# Patient Record
Sex: Female | Born: 1940
Health system: Southern US, Community
[De-identification: ages and names within clinical notes are randomized; demographics above are authoritative.]

## PROBLEM LIST (undated history)

## (undated) DIAGNOSIS — K635 Polyp of colon: Secondary | ICD-10-CM

## (undated) DIAGNOSIS — M1990 Unspecified osteoarthritis, unspecified site: Secondary | ICD-10-CM

## (undated) DIAGNOSIS — K8020 Calculus of gallbladder without cholecystitis without obstruction: Secondary | ICD-10-CM

## (undated) DIAGNOSIS — H269 Unspecified cataract: Secondary | ICD-10-CM

## (undated) DIAGNOSIS — I4891 Unspecified atrial fibrillation: Secondary | ICD-10-CM

## (undated) DIAGNOSIS — I10 Essential (primary) hypertension: Secondary | ICD-10-CM

## (undated) DIAGNOSIS — E669 Obesity, unspecified: Secondary | ICD-10-CM

## (undated) DIAGNOSIS — D649 Anemia, unspecified: Secondary | ICD-10-CM

## (undated) DIAGNOSIS — E039 Hypothyroidism, unspecified: Secondary | ICD-10-CM

## (undated) DIAGNOSIS — Z923 Personal history of irradiation: Secondary | ICD-10-CM

## (undated) DIAGNOSIS — K5790 Diverticulosis of intestine, part unspecified, without perforation or abscess without bleeding: Secondary | ICD-10-CM

## (undated) DIAGNOSIS — C50919 Malignant neoplasm of unspecified site of unspecified female breast: Secondary | ICD-10-CM

## (undated) DIAGNOSIS — E785 Hyperlipidemia, unspecified: Secondary | ICD-10-CM

## (undated) DIAGNOSIS — K5900 Constipation, unspecified: Secondary | ICD-10-CM

## (undated) HISTORY — DX: Obesity, unspecified: E66.9

## (undated) HISTORY — DX: Hypothyroidism, unspecified: E03.9

## (undated) HISTORY — DX: Hyperlipidemia, unspecified: E78.5

## (undated) HISTORY — PX: BREAST LUMPECTOMY: SHX2

## (undated) HISTORY — PX: LYMPH NODE DISSECTION: SHX5087

## (undated) HISTORY — DX: Essential (primary) hypertension: I10

## (undated) HISTORY — DX: Polyp of colon: K63.5

## (undated) HISTORY — DX: Calculus of gallbladder without cholecystitis without obstruction: K80.20

## (undated) HISTORY — PX: POLYPECTOMY: SHX149

## (undated) HISTORY — PX: EYE SURGERY: SHX253

## (undated) HISTORY — PX: CATARACT EXTRACTION, BILATERAL: SHX1313

## (undated) HISTORY — PX: FOOT SURGERY: SHX648

## (undated) HISTORY — DX: Diverticulosis of intestine, part unspecified, without perforation or abscess without bleeding: K57.90

## (undated) HISTORY — PX: OTHER SURGICAL HISTORY: SHX169

## (undated) HISTORY — DX: Unspecified atrial fibrillation: I48.91

## (undated) HISTORY — PX: COLONOSCOPY: SHX174

## (undated) HISTORY — DX: Constipation, unspecified: K59.00

## (undated) HISTORY — DX: Unspecified cataract: H26.9

## (undated) HISTORY — DX: Anemia, unspecified: D64.9

## (undated) HISTORY — DX: Unspecified osteoarthritis, unspecified site: M19.90

## (undated) HISTORY — DX: Malignant neoplasm of unspecified site of unspecified female breast: C50.919

---
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
## 1995-09-18 DIAGNOSIS — Z923 Personal history of irradiation: Secondary | ICD-10-CM

## 1995-09-18 HISTORY — DX: Personal history of irradiation: Z92.3

## 1997-12-16 ENCOUNTER — Encounter: Admission: RE | Admit: 1997-12-16 | Discharge: 1998-03-16 | Payer: Self-pay | Admitting: *Deleted

## 1998-06-30 ENCOUNTER — Other Ambulatory Visit: Admission: RE | Admit: 1998-06-30 | Discharge: 1998-06-30 | Payer: Self-pay | Admitting: *Deleted

## 1999-06-09 ENCOUNTER — Other Ambulatory Visit: Admission: RE | Admit: 1999-06-09 | Discharge: 1999-06-09 | Payer: Self-pay | Admitting: *Deleted

## 2000-06-17 ENCOUNTER — Other Ambulatory Visit: Admission: RE | Admit: 2000-06-17 | Discharge: 2000-06-17 | Payer: Self-pay | Admitting: *Deleted

## 2000-10-09 ENCOUNTER — Encounter: Admission: RE | Admit: 2000-10-09 | Discharge: 2000-10-09 | Payer: Self-pay | Admitting: Emergency Medicine

## 2000-10-09 ENCOUNTER — Encounter: Payer: Self-pay | Admitting: Emergency Medicine

## 2001-02-25 ENCOUNTER — Encounter: Payer: Self-pay | Admitting: Emergency Medicine

## 2001-02-25 ENCOUNTER — Encounter: Admission: RE | Admit: 2001-02-25 | Discharge: 2001-02-25 | Payer: Self-pay | Admitting: Emergency Medicine

## 2001-04-14 ENCOUNTER — Encounter (INDEPENDENT_AMBULATORY_CARE_PROVIDER_SITE_OTHER): Payer: Self-pay | Admitting: *Deleted

## 2001-04-14 ENCOUNTER — Ambulatory Visit (HOSPITAL_COMMUNITY): Admission: RE | Admit: 2001-04-14 | Discharge: 2001-04-14 | Payer: Self-pay | Admitting: Internal Medicine

## 2001-04-14 ENCOUNTER — Encounter: Payer: Self-pay | Admitting: Internal Medicine

## 2001-06-09 ENCOUNTER — Other Ambulatory Visit: Admission: RE | Admit: 2001-06-09 | Discharge: 2001-06-09 | Payer: Self-pay | Admitting: *Deleted

## 2001-12-18 ENCOUNTER — Encounter: Admission: RE | Admit: 2001-12-18 | Discharge: 2001-12-18 | Payer: Self-pay | Admitting: Emergency Medicine

## 2001-12-18 ENCOUNTER — Encounter: Payer: Self-pay | Admitting: Emergency Medicine

## 2002-04-28 ENCOUNTER — Encounter: Payer: Self-pay | Admitting: Internal Medicine

## 2002-04-28 ENCOUNTER — Ambulatory Visit (HOSPITAL_COMMUNITY): Admission: RE | Admit: 2002-04-28 | Discharge: 2002-04-28 | Payer: Self-pay | Admitting: Internal Medicine

## 2002-06-29 ENCOUNTER — Other Ambulatory Visit: Admission: RE | Admit: 2002-06-29 | Discharge: 2002-06-29 | Payer: Self-pay | Admitting: Obstetrics & Gynecology

## 2003-07-08 ENCOUNTER — Other Ambulatory Visit: Admission: RE | Admit: 2003-07-08 | Discharge: 2003-07-08 | Payer: Self-pay | Admitting: Obstetrics & Gynecology

## 2003-09-18 DIAGNOSIS — K635 Polyp of colon: Secondary | ICD-10-CM

## 2003-09-18 HISTORY — DX: Polyp of colon: K63.5

## 2003-10-06 ENCOUNTER — Encounter: Admission: RE | Admit: 2003-10-06 | Discharge: 2003-10-06 | Payer: Self-pay | Admitting: Emergency Medicine

## 2004-07-11 ENCOUNTER — Other Ambulatory Visit: Admission: RE | Admit: 2004-07-11 | Discharge: 2004-07-11 | Payer: Self-pay | Admitting: Obstetrics & Gynecology

## 2005-01-10 ENCOUNTER — Encounter (HOSPITAL_COMMUNITY): Admission: RE | Admit: 2005-01-10 | Discharge: 2005-04-10 | Payer: Self-pay | Admitting: Internal Medicine

## 2005-07-11 ENCOUNTER — Other Ambulatory Visit: Admission: RE | Admit: 2005-07-11 | Discharge: 2005-07-11 | Payer: Self-pay | Admitting: Obstetrics & Gynecology

## 2005-09-11 IMAGING — CT CT ABDOMEN W/ CM
1 series · 15 of 32 positions shown, 19 images · IV contrast (GASTRO. & OMNIPAQUE [ID])
Comparison: none

CLINICAL DATA: Abdominal pain, particularly RLQ.  Con ? V14.8,
 CT ABDOMEN WITH CONTRAST
 Multidetector helical scans through the abdomen were performed after oral and IV contrast media were given. 150 cc of Omnipaque 300 were given as the contrast media to this patient with a history of asthma as well as sickle cell trait.  
 The lung bases are clear.  The liver enhances normally with no focal abnormality and no ductal dilatation is seen. There are one or two rounded structures which are noncalcified within the gallbladder.  This may represent noncalcified gallstones or gallbladder sludge and ultrasound of the gallbladder may be warranted.  The pancreas is relatively fatty infiltrated.  The adrenal glands and spleen appear normal. The kidneys enhance well with only a few small subcentimeter rounded low attenuation structures most consistent with incidental cysts, but too to characterize.  No adenopathy is seen. The abdominal aorta is normal in caliber.  
 IMPRESSION
 1.  Rounded low attenuation structures in the gallbladder may represent noncalcified gallstones or sludge.  Consider ultrasound of the gallbladder to assess further. 
 2.  Fatty infiltration of the pancreas. 
 3.  Small low attenuation rounded renal structures consistent with cysts, but too small to characterize.
 CT PELVIS WITH CONTRAST
 Scans were continued through the pelvis after oral and IV contrast media were given. The appendix is well seen and appears normal. The ureters are normal in caliber.  The uterus is normal in size.  The urinary bladder is unremarkable.  No pelvic mass or adenopathy is seen. 
 Negative CT of the pelvis.  The appendix is well seen and appears normal.

[Series 2: appendicitis · axial · 0.98mm/px · z∈[-414,-2]mm · 15 of 140 slices shown, 19 images]
[im 9/140  soft-tissue]
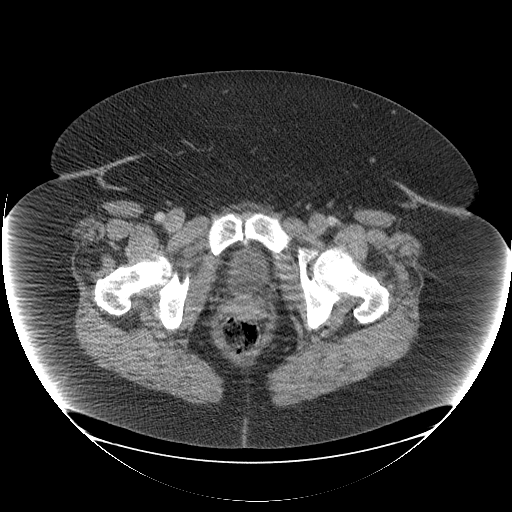
[im 9/140  bone]
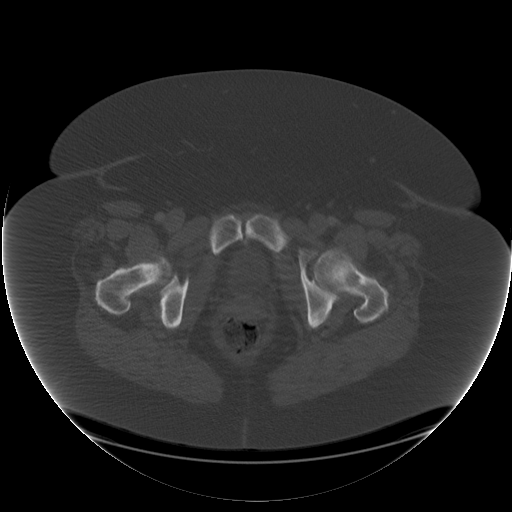
[im 18/140  soft-tissue]
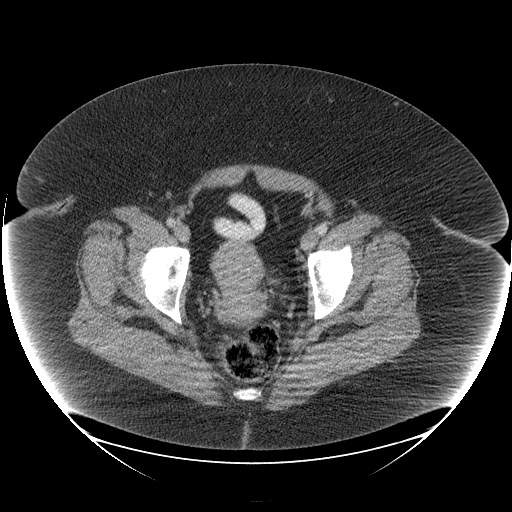
[im 27/140  soft-tissue]
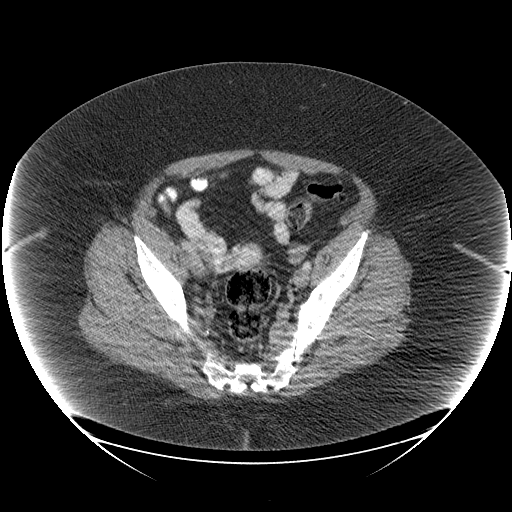
[im 41/140  soft-tissue]
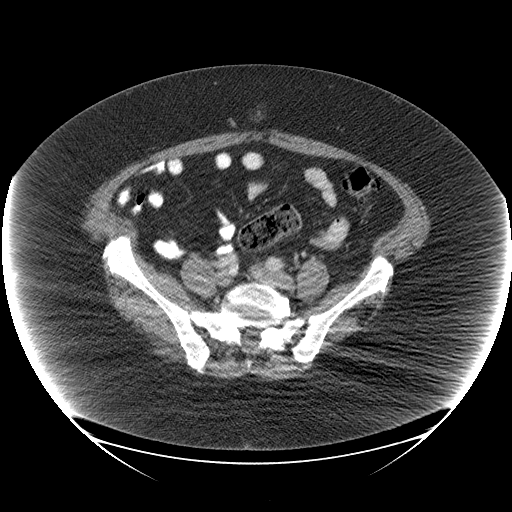
[im 50/140  soft-tissue]
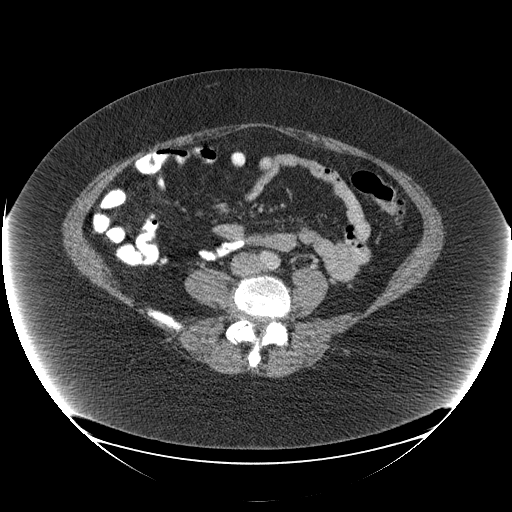
[im 59/140  soft-tissue]
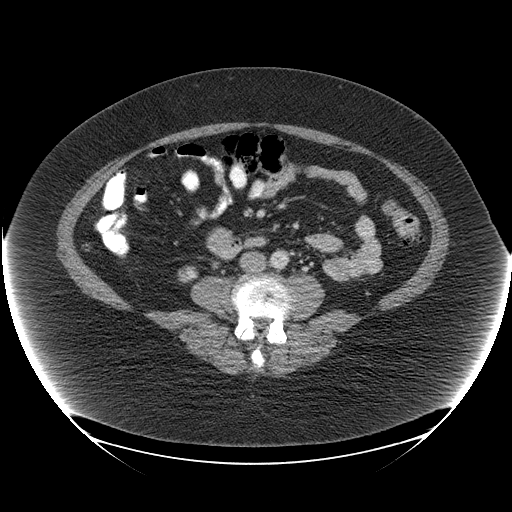
[im 72/140  soft-tissue]
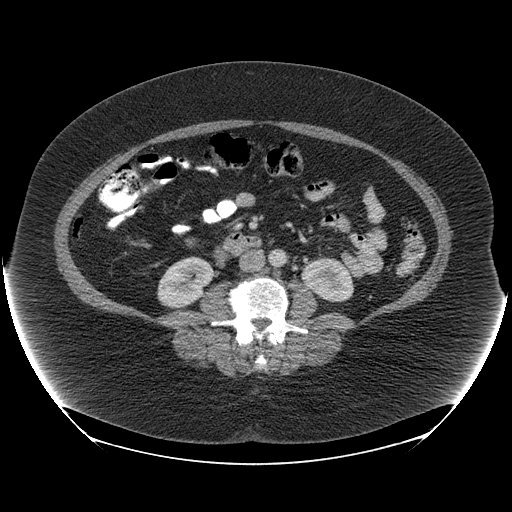
[im 81/140  soft-tissue]
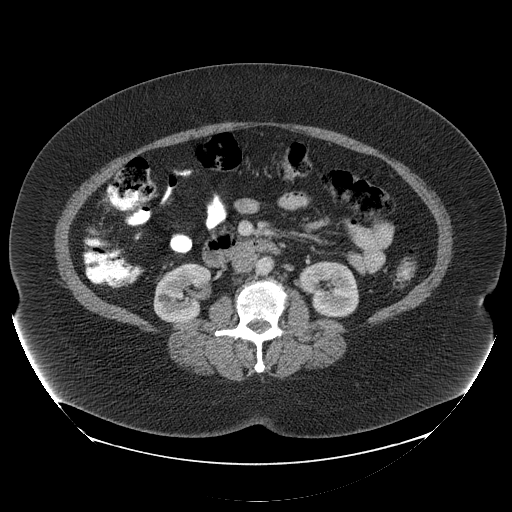
[im 90/140  soft-tissue]
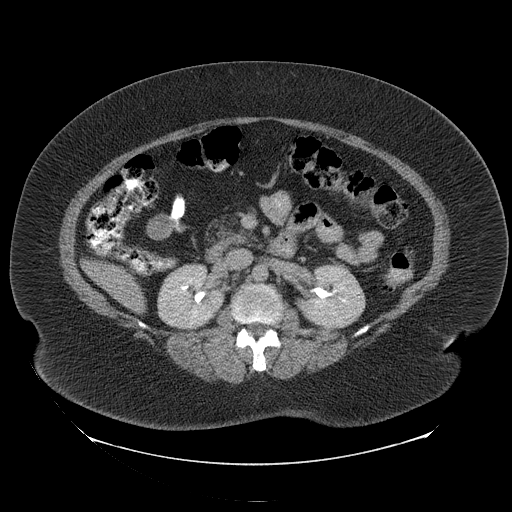
[im 90/140  bone]
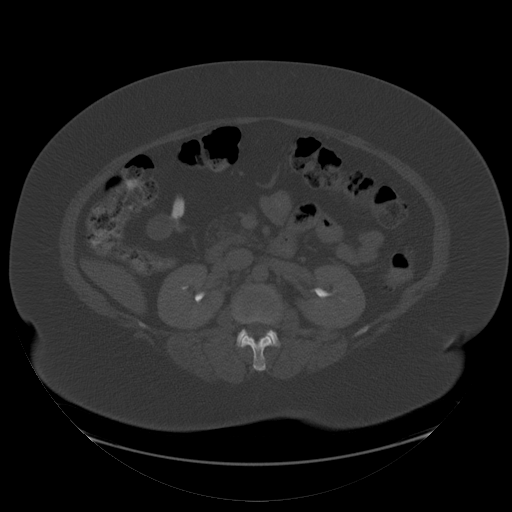
[im 99/140  soft-tissue]
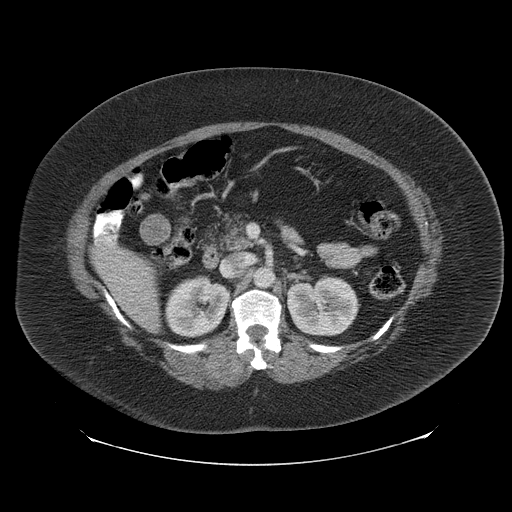
[im 113/140  soft-tissue]
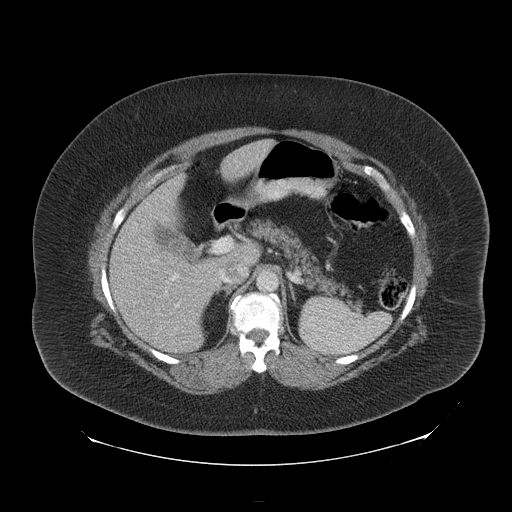
[im 122/140  soft-tissue]
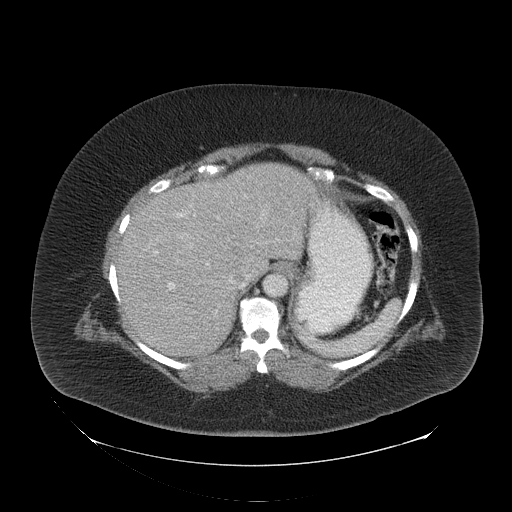
[im 122/140  lung]
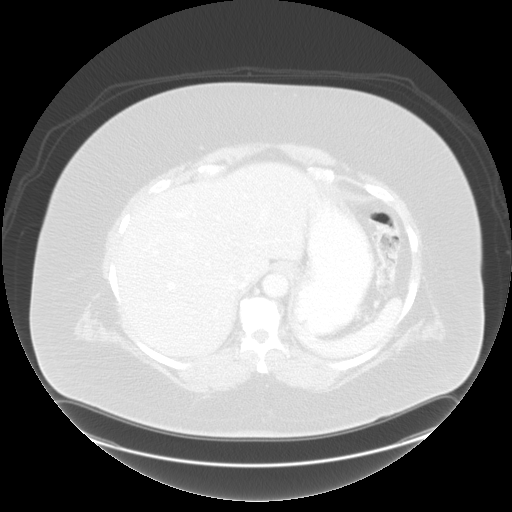
[im 126/140  lung]
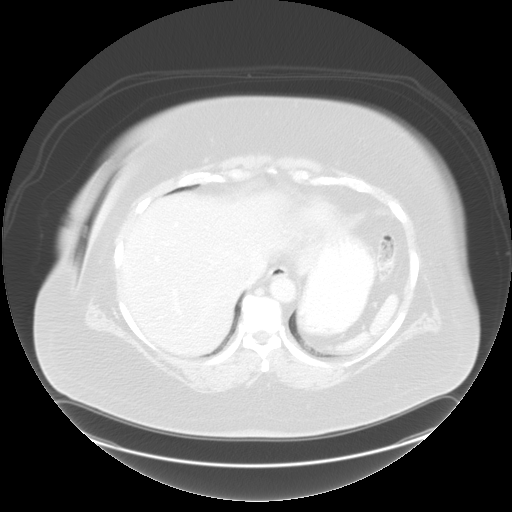
[im 131/140  soft-tissue]
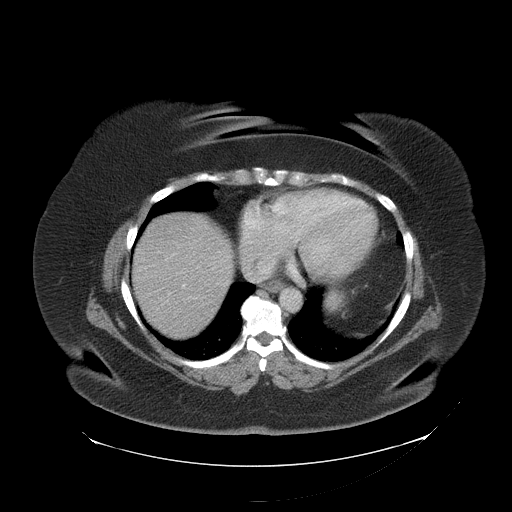
[im 131/140  lung]
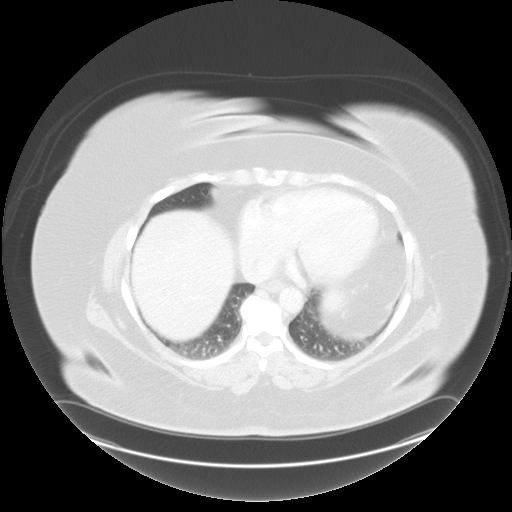
[im 135/140  lung]
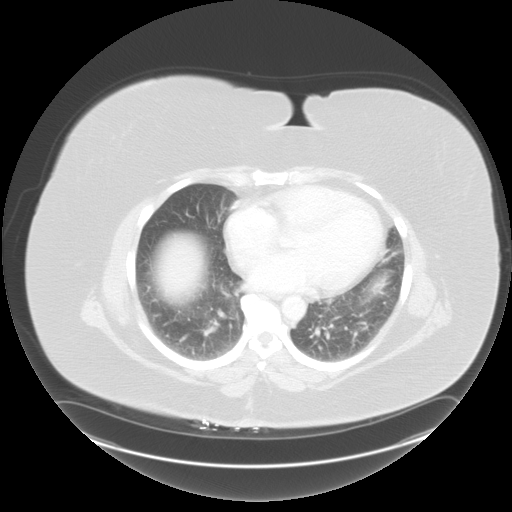

[15 of 32 positions shown; findings below may reference images not displayed]

## 2006-05-27 ENCOUNTER — Encounter: Admission: RE | Admit: 2006-05-27 | Discharge: 2006-05-27 | Payer: Self-pay | Admitting: Emergency Medicine

## 2006-11-27 ENCOUNTER — Ambulatory Visit: Payer: Self-pay | Admitting: Cardiology

## 2006-12-02 ENCOUNTER — Ambulatory Visit: Payer: Self-pay | Admitting: Internal Medicine

## 2006-12-10 ENCOUNTER — Ambulatory Visit: Payer: Self-pay

## 2006-12-10 ENCOUNTER — Ambulatory Visit: Payer: Self-pay | Admitting: Cardiology

## 2006-12-10 ENCOUNTER — Encounter: Payer: Self-pay | Admitting: Cardiology

## 2006-12-13 ENCOUNTER — Ambulatory Visit: Payer: Self-pay | Admitting: Cardiology

## 2006-12-17 ENCOUNTER — Ambulatory Visit: Payer: Self-pay | Admitting: Internal Medicine

## 2006-12-17 ENCOUNTER — Ambulatory Visit: Payer: Self-pay | Admitting: Cardiology

## 2006-12-27 ENCOUNTER — Ambulatory Visit: Payer: Self-pay | Admitting: Internal Medicine

## 2007-01-02 ENCOUNTER — Ambulatory Visit: Payer: Self-pay | Admitting: Cardiology

## 2007-01-02 ENCOUNTER — Ambulatory Visit: Payer: Self-pay | Admitting: Internal Medicine

## 2007-01-08 ENCOUNTER — Ambulatory Visit: Payer: Self-pay | Admitting: Internal Medicine

## 2007-01-14 ENCOUNTER — Ambulatory Visit: Payer: Self-pay | Admitting: Cardiology

## 2007-01-21 ENCOUNTER — Ambulatory Visit: Payer: Self-pay | Admitting: Cardiology

## 2007-01-28 ENCOUNTER — Ambulatory Visit: Payer: Self-pay | Admitting: Cardiology

## 2007-02-03 ENCOUNTER — Ambulatory Visit: Payer: Self-pay | Admitting: Cardiology

## 2007-02-03 ENCOUNTER — Ambulatory Visit (HOSPITAL_COMMUNITY): Admission: RE | Admit: 2007-02-03 | Discharge: 2007-02-03 | Payer: Self-pay | Admitting: Cardiology

## 2007-02-13 ENCOUNTER — Ambulatory Visit: Payer: Self-pay | Admitting: *Deleted

## 2007-02-17 ENCOUNTER — Ambulatory Visit: Payer: Self-pay | Admitting: Cardiology

## 2007-03-13 ENCOUNTER — Ambulatory Visit: Payer: Self-pay | Admitting: Cardiology

## 2007-04-15 ENCOUNTER — Ambulatory Visit: Payer: Self-pay | Admitting: Cardiology

## 2007-04-29 ENCOUNTER — Ambulatory Visit: Payer: Self-pay | Admitting: Cardiology

## 2007-05-02 ENCOUNTER — Ambulatory Visit: Payer: Self-pay | Admitting: Cardiology

## 2007-05-06 ENCOUNTER — Ambulatory Visit: Payer: Self-pay | Admitting: Cardiology

## 2007-05-22 ENCOUNTER — Ambulatory Visit: Payer: Self-pay | Admitting: Cardiology

## 2007-05-29 ENCOUNTER — Ambulatory Visit: Payer: Self-pay | Admitting: Internal Medicine

## 2007-05-29 ENCOUNTER — Ambulatory Visit: Payer: Self-pay | Admitting: Cardiology

## 2007-06-16 ENCOUNTER — Ambulatory Visit: Payer: Self-pay | Admitting: Cardiovascular Disease

## 2007-06-27 ENCOUNTER — Ambulatory Visit: Payer: Self-pay | Admitting: Cardiology

## 2007-07-04 ENCOUNTER — Ambulatory Visit: Payer: Self-pay | Admitting: Cardiology

## 2007-07-18 ENCOUNTER — Ambulatory Visit: Payer: Self-pay | Admitting: Cardiology

## 2007-08-18 ENCOUNTER — Ambulatory Visit: Payer: Self-pay | Admitting: Internal Medicine

## 2007-09-08 ENCOUNTER — Ambulatory Visit: Payer: Self-pay | Admitting: Cardiology

## 2007-09-17 ENCOUNTER — Ambulatory Visit: Payer: Self-pay | Admitting: Cardiology

## 2007-10-01 ENCOUNTER — Ambulatory Visit: Payer: Self-pay | Admitting: Cardiovascular Disease

## 2007-10-22 ENCOUNTER — Ambulatory Visit: Payer: Self-pay | Admitting: Internal Medicine

## 2007-11-06 ENCOUNTER — Ambulatory Visit (HOSPITAL_COMMUNITY): Admission: RE | Admit: 2007-11-06 | Discharge: 2007-11-06 | Payer: Self-pay | Admitting: Internal Medicine

## 2007-11-06 ENCOUNTER — Ambulatory Visit: Payer: Self-pay | Admitting: Cardiology

## 2007-12-04 ENCOUNTER — Ambulatory Visit: Payer: Self-pay | Admitting: Cardiology

## 2007-12-19 ENCOUNTER — Ambulatory Visit: Payer: Self-pay | Admitting: Cardiology

## 2007-12-30 ENCOUNTER — Ambulatory Visit: Payer: Self-pay | Admitting: Cardiology

## 2008-01-01 ENCOUNTER — Ambulatory Visit: Payer: Self-pay | Admitting: Cardiology

## 2008-01-16 ENCOUNTER — Ambulatory Visit: Payer: Self-pay | Admitting: Internal Medicine

## 2008-02-13 ENCOUNTER — Ambulatory Visit: Payer: Self-pay | Admitting: Cardiology

## 2008-03-12 ENCOUNTER — Ambulatory Visit: Payer: Self-pay | Admitting: Cardiology

## 2008-03-26 ENCOUNTER — Ambulatory Visit: Payer: Self-pay | Admitting: Cardiovascular Disease

## 2008-04-23 ENCOUNTER — Ambulatory Visit: Payer: Self-pay | Admitting: Cardiology

## 2008-05-21 ENCOUNTER — Ambulatory Visit: Payer: Self-pay | Admitting: Cardiovascular Disease

## 2008-06-18 ENCOUNTER — Ambulatory Visit: Payer: Self-pay | Admitting: Cardiovascular Disease

## 2008-07-15 ENCOUNTER — Ambulatory Visit: Payer: Self-pay | Admitting: Cardiovascular Disease

## 2008-07-15 ENCOUNTER — Ambulatory Visit: Payer: Self-pay | Admitting: Cardiology

## 2008-07-18 DIAGNOSIS — I10 Essential (primary) hypertension: Secondary | ICD-10-CM | POA: Insufficient documentation

## 2008-07-18 DIAGNOSIS — E039 Hypothyroidism, unspecified: Secondary | ICD-10-CM | POA: Insufficient documentation

## 2008-07-18 DIAGNOSIS — I4821 Permanent atrial fibrillation: Secondary | ICD-10-CM

## 2008-07-18 DIAGNOSIS — Z853 Personal history of malignant neoplasm of breast: Secondary | ICD-10-CM

## 2008-07-18 DIAGNOSIS — J45909 Unspecified asthma, uncomplicated: Secondary | ICD-10-CM | POA: Insufficient documentation

## 2008-08-10 ENCOUNTER — Ambulatory Visit: Payer: Self-pay | Admitting: Cardiology

## 2008-09-07 ENCOUNTER — Ambulatory Visit: Payer: Self-pay | Admitting: Internal Medicine

## 2008-10-05 ENCOUNTER — Ambulatory Visit: Payer: Self-pay | Admitting: Cardiology

## 2008-11-02 ENCOUNTER — Ambulatory Visit: Payer: Self-pay | Admitting: Cardiology

## 2008-11-07 ENCOUNTER — Encounter: Payer: Self-pay | Admitting: Cardiology

## 2008-11-23 ENCOUNTER — Ambulatory Visit: Payer: Self-pay | Admitting: Cardiology

## 2008-12-20 ENCOUNTER — Ambulatory Visit: Payer: Self-pay | Admitting: Cardiology

## 2009-01-17 ENCOUNTER — Ambulatory Visit: Payer: Self-pay | Admitting: Cardiology

## 2009-02-15 ENCOUNTER — Encounter: Payer: Self-pay | Admitting: *Deleted

## 2009-02-15 ENCOUNTER — Ambulatory Visit: Payer: Self-pay | Admitting: Cardiology

## 2009-03-01 ENCOUNTER — Telehealth: Payer: Self-pay | Admitting: Cardiology

## 2009-03-03 ENCOUNTER — Encounter: Payer: Self-pay | Admitting: Cardiology

## 2009-03-15 ENCOUNTER — Ambulatory Visit: Payer: Self-pay | Admitting: Internal Medicine

## 2009-03-15 LAB — CONVERTED CEMR LAB
POC INR: 2
Prothrombin Time: 17.6 s

## 2009-03-23 ENCOUNTER — Encounter: Payer: Self-pay | Admitting: *Deleted

## 2009-04-08 ENCOUNTER — Ambulatory Visit: Payer: Self-pay | Admitting: Internal Medicine

## 2009-04-08 LAB — CONVERTED CEMR LAB: Prothrombin Time: 19.5 s

## 2009-05-04 ENCOUNTER — Encounter (INDEPENDENT_AMBULATORY_CARE_PROVIDER_SITE_OTHER): Payer: Self-pay | Admitting: *Deleted

## 2009-05-06 ENCOUNTER — Ambulatory Visit: Payer: Self-pay | Admitting: Cardiovascular Disease

## 2009-06-03 ENCOUNTER — Ambulatory Visit: Payer: Self-pay | Admitting: Internal Medicine

## 2009-07-01 ENCOUNTER — Ambulatory Visit: Payer: Self-pay | Admitting: Internal Medicine

## 2009-07-01 LAB — CONVERTED CEMR LAB: POC INR: 2.5

## 2009-07-15 ENCOUNTER — Ambulatory Visit: Payer: Self-pay | Admitting: Cardiology

## 2009-07-15 DIAGNOSIS — Z6841 Body Mass Index (BMI) 40.0 and over, adult: Secondary | ICD-10-CM

## 2009-07-29 ENCOUNTER — Ambulatory Visit: Payer: Self-pay | Admitting: Cardiology

## 2009-07-29 LAB — CONVERTED CEMR LAB: POC INR: 2.3

## 2009-08-26 ENCOUNTER — Ambulatory Visit: Payer: Self-pay | Admitting: Cardiology

## 2009-09-23 ENCOUNTER — Ambulatory Visit: Payer: Self-pay | Admitting: Cardiology

## 2009-09-23 LAB — CONVERTED CEMR LAB: POC INR: 2.2

## 2009-10-21 ENCOUNTER — Ambulatory Visit: Payer: Self-pay | Admitting: Internal Medicine

## 2009-10-21 LAB — CONVERTED CEMR LAB: POC INR: 2.4

## 2009-10-24 ENCOUNTER — Encounter: Payer: Self-pay | Admitting: Cardiology

## 2009-10-24 ENCOUNTER — Telehealth: Payer: Self-pay | Admitting: Cardiology

## 2009-10-27 ENCOUNTER — Encounter: Payer: Self-pay | Admitting: Cardiology

## 2009-11-07 ENCOUNTER — Encounter: Payer: Self-pay | Admitting: Cardiology

## 2009-11-18 ENCOUNTER — Encounter: Payer: Self-pay | Admitting: Cardiology

## 2009-11-18 ENCOUNTER — Ambulatory Visit: Payer: Self-pay | Admitting: Cardiology

## 2009-11-18 LAB — CONVERTED CEMR LAB: POC INR: 2.1

## 2009-12-02 ENCOUNTER — Ambulatory Visit: Payer: Self-pay | Admitting: Cardiology

## 2009-12-04 LAB — CONVERTED CEMR LAB
Eosinophils Absolute: 0.2 10*3/uL (ref 0.0–0.7)
Eosinophils Relative: 2.6 % (ref 0.0–5.0)
Hemoglobin: 13.3 g/dL (ref 12.0–15.0)
MCHC: 32.5 g/dL (ref 30.0–36.0)
Monocytes Absolute: 0.8 10*3/uL (ref 0.1–1.0)
Neutro Abs: 3.9 10*3/uL (ref 1.4–7.7)
Neutrophils Relative %: 59.3 % (ref 43.0–77.0)
RDW: 15.3 % — ABNORMAL HIGH (ref 11.5–14.6)

## 2009-12-27 ENCOUNTER — Ambulatory Visit: Payer: Self-pay | Admitting: Cardiology

## 2010-04-11 ENCOUNTER — Encounter: Admission: RE | Admit: 2010-04-11 | Discharge: 2010-04-11 | Payer: Self-pay | Admitting: Emergency Medicine

## 2010-07-05 ENCOUNTER — Telehealth (INDEPENDENT_AMBULATORY_CARE_PROVIDER_SITE_OTHER): Payer: Self-pay | Admitting: *Deleted

## 2010-07-14 ENCOUNTER — Ambulatory Visit: Payer: Self-pay | Admitting: Cardiology

## 2010-07-14 ENCOUNTER — Encounter: Payer: Self-pay | Admitting: Cardiology

## 2010-07-14 DIAGNOSIS — R0602 Shortness of breath: Secondary | ICD-10-CM | POA: Insufficient documentation

## 2010-08-02 ENCOUNTER — Ambulatory Visit: Payer: Self-pay | Admitting: Cardiology

## 2010-08-02 ENCOUNTER — Ambulatory Visit: Payer: Self-pay

## 2010-10-17 NOTE — Progress Notes (Signed)
Summary: call from MD  - can pt come off Pradaxa for a breast biopsy  Phone Note From Other Clinic   Caller: Dr. Dossie Der Summary of Call: ? whether to hold Pradaxa   Follow-up for Phone Call        Dr. Dossie Der calls today about whether to stop pradaxa or not.   She is aware the bleeding risks are the same as for Coumadin and in this case if she was on coumadin they would hold it.  Mrs. [name] [name] will need a 14 gauge needle biopsy of a calcified area of the breast.  This is scheduled for 10/26. I will forward to Dr. Antoine Poche for his recommendation. She is aware he is in [place] today and will await an answer this week.  We will call Dr. Annell Greening back at [phone] and she will inform Mrs. [name] [name]. Mylo Red RN     Appended Document: call from MD  - can pt come off Pradaxa for a breast biopsy Call Shanda Bumps [phone]  Appended Document: call from MD  - can pt come off Pradaxa for a breast biopsy I believe that her most recent creat clearance was OK.  However, since I do not see a recent creat I would hold her Pradaxa x 6 doses prior to the procedure.  Due to the rapid onset of action, this should not be restarted until it is felt that the risk of bleeding from the procedure is minimal.  She will need to get this instruction from the physician performing the procedure.  Appended Document: call from MD  - can pt come off Pradaxa for a breast biopsy left message for Shanda Bumps of the above instructions from Dr Antoine Poche

## 2010-10-17 NOTE — Medication Information (Signed)
Summary: [name] [name]  Anticoagulant Therapy  Managed by: Weston Brass, Pharm D Referring MD: Rollene Rotunda MD PCP: Dr. Leslee Home Supervising MD: Antoine Poche MD, Fayrene Fearing Indication 1: Atrial Fibrillation (ICD-427.31) Lab Used: [facility] Site: [facility] INR POC 2.2 INR RANGE 2 - 3  Dietary changes: no    Health status changes: yes       Details: had a cold  Bleeding/hemorrhagic complications: no    Recent/future hospitalizations: no    Any changes in medication regimen? no    Recent/future dental: no  Any missed doses?: no       Is patient compliant with meds? yes       Allergies (verified): 1)  Cortisone  Anticoagulation Management History:      The patient is taking warfarin and comes in today for a routine follow up visit.  Positive risk factors for bleeding include an age of 70 years or older.  The bleeding index is 'intermediate risk'.  Positive CHADS2 values include History of HTN.  Negative CHADS2 values include Age > 76 years old.  The start date was 11/21/2006.  Anticoagulation responsible provider: Antoine Poche MD, Fayrene Fearing.  INR POC: 2.2.  Cuvette Lot#: [id number].  Exp: 10/2010.    Anticoagulation Management Assessment/Plan:      The patient's current anticoagulation dose is Coumadin 5 mg tabs: Take as directed by coumadin clinic..  The target INR is 2.0-3.0.  The next INR is due 10/21/2009.  Anticoagulation instructions were given to patient.  Results were reviewed/authorized by Weston Brass, Pharm D.  She was notified by Ysidro Evert, Pharm D Candidate.         Prior Anticoagulation Instructions: INR 2.9  Continue on same dosage 1/2 tablet daily except 1 tablet on Sundays and Thursdays.  Recheck in 4 weeks.     Current Anticoagulation Instructions: INR 2.2 at goal (2-3) Continue same dosage 2.5mg  daily except 5mg  on Sundays and Thursdays Recheck in 4 weeks

## 2010-10-17 NOTE — Progress Notes (Signed)
Summary: coumadin   Phone Note From Other Clinic   Caller: Provider Summary of Call: Dr Leslee Home [phone] ofc [phone] cell. pt on coumadin wants to know if she can go on new medication. Pradaxa.  Initial call taken by: Edman Circle,  October 24, 2009 4:11 PM  Follow-up for Phone Call        Discussed with Dr. Lorenz Coaster. Follow-up by: Rollene Rotunda, MD, [facility],  November 01, 2009 4:47 PM

## 2010-10-17 NOTE — Medication Information (Signed)
Summary: rov/ez  Anticoagulant Therapy  Managed by: Cloyde Reams, RN, BSN Referring MD: Rollene Rotunda MD PCP: Dr. Leslee Home Supervising MD: Johney Frame MD, Fayrene Fearing Indication 1: Atrial Fibrillation (ICD-427.31) Lab Used: [facility] Site: [facility] INR POC 2.4 INR RANGE 2 - 3  Dietary changes: no    Health status changes: no    Bleeding/hemorrhagic complications: no    Recent/future hospitalizations: no    Any changes in medication regimen? no    Recent/future dental: no  Any missed doses?: no       Is patient compliant with meds? yes       Allergies (verified): 1)  Cortisone  Anticoagulation Management History:      The patient is taking warfarin and comes in today for a routine follow up visit.  Positive risk factors for bleeding include an age of 70 years or older.  The bleeding index is 'intermediate risk'.  Positive CHADS2 values include History of HTN.  Negative CHADS2 values include Age > 70 years old.  The start date was 11/21/2006.  Anticoagulation responsible provider: Murriel Eidem MD, Fayrene Fearing.  INR POC: 2.4.  Cuvette Lot#: [id number].  Exp: 12/2010.    Anticoagulation Management Assessment/Plan:      The patient's current anticoagulation dose is Coumadin 5 mg tabs: Take as directed by coumadin clinic..  The target INR is 2.0-3.0.  The next INR is due 11/18/2009.  Anticoagulation instructions were given to patient.  Results were reviewed/authorized by Cloyde Reams, RN, BSN.  She was notified by Cloyde Reams RN.         Prior Anticoagulation Instructions: INR 2.2 at goal (2-3) Continue same dosage 2.5mg  daily except 5mg  on Sundays and Thursdays Recheck in 4 weeks  Current Anticoagulation Instructions: INR 2.4  Continue on same dosage 1/2 tablet daily except 1 tablet on Sundays and Thursdays.  Recheck in 4 weeks.

## 2010-10-17 NOTE — Medication Information (Signed)
Summary: Coumadin Clinic  Anticoagulant Therapy  Managed by: Inactive Referring MD: Rollene Rotunda MD PCP: Dr. Leslee Home Supervising MD: Juanda Chance MD, Jennine Peddy Indication 1: Atrial Fibrillation (ICD-427.31) Lab Used: [facility] Site: [facility] INR RANGE 2 - 3          Comments: starting pradaxa next week..instructed to hold 2 days of warfarin prior to starting pradaxa since INR on 3/4 was 2.1.2011.  Pt educated to have labs drawn per protocol.. mp  Allergies: 1)  Cortisone  Anticoagulation Management History:      Positive risk factors for bleeding include an age of 70 years or older.  The bleeding index is 'intermediate risk'.  Positive CHADS2 values include History of HTN.  Negative CHADS2 values include Age > 56 years old.  The start date was 11/21/2006.  Anticoagulation responsible provider: Juanda Chance MD, Smitty Cords.  Exp: 12/2010.    Anticoagulation Management Assessment/Plan:      The target INR is 2.0-3.0.  The next INR is due 11/18/2009.  Anticoagulation instructions were given to patient.  Results were reviewed/authorized by Inactive.         Prior Anticoagulation Instructions: INR 2.1  Take 1 tab each Sunday and Thursday and 0.5 tab on all other days.  Hold warfarin for 2 days prior to starting pradaxa.  Take pradaxa 2 times daily--if you develop GI upset or pain, CALL DR. KELLER immediately!  Have blood count completed 10 days after starting on pradaxa.

## 2010-10-17 NOTE — Medication Information (Signed)
Summary: rov/ewj  Anticoagulant Therapy  Managed by: Shelby Dubin, PharmD, BCPS, CPP Referring MD: Rollene Rotunda MD PCP: Dr. Leslee Home Supervising MD: Juanda Chance MD, Ehan Freas Indication 1: Atrial Fibrillation (ICD-427.31) Lab Used: [facility] Site: [facility] INR POC 2.1 INR RANGE 2 - 3  Dietary changes: no    Health status changes: no    Bleeding/hemorrhagic complications: no    Recent/future hospitalizations: no    Any changes in medication regimen? yes       Details: will begin niaspan and pradaxa  Recent/future dental: no  Any missed doses?: no       Is patient compliant with meds? yes       Current Medications (verified): 1)  Pindolol 5 Mg Tabs (Pindolol) .... 1/2 By Mouth Daily 2)  Cardizem Cd 120 Mg Xr24h-Cap (Diltiazem Hcl Coated Beads) .... Take 1 Capsule By Mouth Once A Day 3)  Hydrochlorothiazide 25 Mg Tabs (Hydrochlorothiazide) .... Take 1/2 Tablet Daily 4)  Vitamin C 500 Mg Tabs (Ascorbic Acid) .... Daily 5)  Vitamin D 1000 Unit Tabs (Cholecalciferol) .... Daily 6)  Multi-Vitamin  Tabs (Multiple Vitamin) .... Take 1 Tablet By Mouth Once A Day 7)  Niaspan 500 Mg Cr-Tabs (Niacin (Antihyperlipidemic)) .Marland Kitchen.. 1 By Mouth At Bedtime. 8)  Pradaxa 150 Mg Caps (Dabigatran Etexilate Mesylate) .Marland Kitchen.. 1 By Mouth Two Times A Day  Allergies (verified): 1)  Cortisone  Anticoagulation Management History:      The patient is taking warfarin and comes in today for a routine follow up visit.  Positive risk factors for bleeding include an age of 13 years or older.  The bleeding index is 'intermediate risk'.  Positive CHADS2 values include History of HTN.  Negative CHADS2 values include Age > 29 years old.  The start date was 11/21/2006.  Anticoagulation responsible provider: Juanda Chance MD, Smitty Cords.  INR POC: 2.1.  Exp: 12/2010.    Anticoagulation Management Assessment/Plan:      The target INR is 2.0-3.0.  The next INR is due 11/18/2009.  Anticoagulation instructions were given to  patient.  Results were reviewed/authorized by Shelby Dubin, PharmD, BCPS, CPP.  She was notified by Shelby Dubin PharmD, BCPS, CPP.         Prior Anticoagulation Instructions: INR 2.4  Continue on same dosage 1/2 tablet daily except 1 tablet on Sundays and Thursdays.  Recheck in 4 weeks.    Current Anticoagulation Instructions: INR 2.1  Take 1 tab each Sunday and Thursday and 0.5 tab on all other days.  Hold warfarin for 2 days prior to starting pradaxa.  Take pradaxa 2 times daily--if you develop GI upset or pain, CALL DR. KELLER immediately!  Have blood count completed 10 days after starting on pradaxa.

## 2010-10-17 NOTE — Assessment & Plan Note (Signed)
Summary: Dr Lorenz Coaster started her on Pradaxa   Visit Type:  Follow-up Primary Provider:  Dr. Leslee Home  CC:  Atrial Fibrillation.  History of Present Illness: The patient returns for folllow up of atrial fibrillation.  Since I last saw her she has been switched to Pradaxa.  She loves this new drug in the Freedom it provides. She's had no problems with it. She will occasionally feel some palpitations that she relates to the hydrochlorothiazide. However, these are relatively short-lived. She's not having any presyncope or syncope. She has no chest pressure, neck or arm discomfort. She has no significant shortness of breath, PND or orthopnea. She is dieting through her church and has lost some weight. She is exercising 4 were 5 times per week.  Current Medications (verified): 1)  Pindolol 5 Mg Tabs (Pindolol) .... 1/2 By Mouth Daily 2)  Cardizem Cd 120 Mg Xr24h-Cap (Diltiazem Hcl Coated Beads) .... Take 1 Capsule By Mouth Once A Day 3)  Hydrochlorothiazide 25 Mg Tabs (Hydrochlorothiazide) .... Take 1/2 Tablet Daily 4)  Vitamin C 500 Mg Tabs (Ascorbic Acid) .... Daily 5)  Vitamin D 1000 Unit Tabs (Cholecalciferol) .... Daily 6)  Multi-Vitamin  Tabs (Multiple Vitamin) .... Take 1 Tablet By Mouth Once A Day 7)  Pradaxa 150 Mg Caps (Dabigatran Etexilate Mesylate) .Marland Kitchen.. 1 By Mouth Two Times A Day 8)  Fish Oil   Oil (Fish Oil) .Marland Kitchen.. 1 By Mouth Daily  Allergies (verified): 1)  Cortisone  Past History:  Past Medical History: 1. Atrial fibrillation, persistent and recurrent (status post cardioversion). 2. Hypothyroidism. 3. Hypertension. 4. Dyslipidemia. 5. Asthma  Past Surgical History: Reviewed history from 07/18/2008 and no changes required. Breast cancer with lumpectomy and 33 radiation treatments 1997 and 1998, 16 lymph nodes resected, mole resected, cyst  removed.   Review of Systems       As stated in the HPI and negative for all other systems.   Vital Signs:  Patient profile:    70 year old female Height:      66 inches Weight:      294 pounds BMI:     47.62 Pulse rate:   75 / minute Resp:     16 per minute BP sitting:   132 / 77  (left arm)  Vitals Entered By: Marrion Coy, CNA (December 27, 2009 11:17 AM)  Physical Exam  General:  Well developed, well nourished, in no acute distress. Head:  normocephalic and atraumatic Mouth:  Teeth, gums and palate normal. Oral mucosa normal. Neck:  Neck supple, no JVD. No masses, thyromegaly or abnormal cervical nodes. Chest Wall:  no deformities or breast masses noted Lungs:  Clear bilaterally to auscultation and percussion. Heart:  Non-displaced PMI, chest non-tender; irregular rate and rhythm, S1, S2 without murmurs, rubs or gallops. Carotid upstroke normal, no bruit. Normal abdominal aortic size (compromised by morbid obesity), no bruits. Femorals normal pulses, no bruits. Pedals normal pulses. Trace bilateral lower extremity edema, no varicosities. Abdomen:  Bowel sounds positive; abdomen soft and non-tender without masses, organomegaly, or hernias noted, obese Msk:  Back normal, normal gait. Muscle strength and tone normal. Extremities:  No clubbing or cyanosis. Neurologic:  Alert and oriented x 3. Skin:  Intact without lesions or rashes. Psych:  Normal affect.   Impression & Recommendations:  Problem # 1:  ATRIAL FIBRILLATION (ICD-427.31) She tolerates this regimen seems to be doing well with her new anticoagulant. No change in therapy is indicated.  Problem # 2:  OBESITY, UNSPECIFIED (ICD-278.00)  I applaud her weight loss today and encourage more of the same.  Problem # 3:  ESSENTIAL HYPERTENSION, BENIGN (ICD-401.1) Her blood pressure is controlled and she will continue with the meds as listed.  Patient Instructions: 1)  Your physician recommends that you schedule a follow-up appointment in: 1 yr 2)  Your physician recommends that you continue on your current medications as directed. Please refer to the  Current Medication list given to you today.

## 2010-10-17 NOTE — Letter (Signed)
Summary: External Correspondence  External Correspondence   Imported By: Earl Many 12/21/2009 18:31:04  _____________________________________________________________________  External Attachment:    Type:   Image     Comment:   External Document

## 2010-10-17 NOTE — Assessment & Plan Note (Signed)
Summary: 1 yr rov 427.31  pfh,rn   Visit Type:  Follow-up Primary Provider:  Dr. Leslee Home  CC:  Atrial Fibrillation.  History of Present Illness: The patient presents for followup of atrial fibrillation. Pradaxa is currently being held because she had a breast biopsy 2 days ago. She has been on Weight [employer] and has lost 25 pounds! She feels good. She has some rare palpitations. However, she has had no presyncope or syncope. She's not having any chest pain. She has no  PND or orthopnea. She does get some shortness of breath with activity. Of note her breast biopsy show calcifications no malignancy.  Current Medications (verified): 1)  Pindolol 5 Mg Tabs (Pindolol) .... 1/2 By Mouth Daily 2)  Cardizem Cd 120 Mg Xr24h-Cap (Diltiazem Hcl Coated Beads) .... Take 1 Capsule By Mouth Once A Day 3)  Hydrochlorothiazide 25 Mg Tabs (Hydrochlorothiazide) .... Take 1/2 Tablet Daily 4)  Vitamin C 500 Mg Tabs (Ascorbic Acid) .... Hold 5)  Vitamin D 1000 Unit Tabs (Cholecalciferol) .... Hold 6)  Multi-Vitamin  Tabs (Multiple Vitamin) .... Take 1 Tablet By Mouth Once A Day 7)  Pradaxa 150 Mg Caps (Dabigatran Etexilate Mesylate) .... Hold 8)  Fish Oil   Oil (Fish Oil) .... Hold  Allergies (verified): 1)  Cortisone  Past History:  Past Medical History: Reviewed history from 12/27/2009 and no changes required. 1. Atrial fibrillation, persistent and recurrent (status post cardioversion). 2. Hypothyroidism. 3. Hypertension. 4. Dyslipidemia. 5. Asthma  Review of Systems       As stated in the HPI and negative for all other systems.   Vital Signs:  Patient profile:   70 year old female Height:      66 inches Weight:      286 pounds BMI:     46.33 Pulse rate:   90 / minute Resp:     18 per minute BP sitting:   122 / 80  (right arm)  Vitals Entered By: Marrion Coy, CNA (July 14, 2010 9:11 AM)  Physical Exam  General:  Well developed, well nourished, in no acute distress. Head:   normocephalic and atraumatic Neck:  Neck supple, no JVD. No masses, thyromegaly or abnormal cervical nodes. Chest Wall:  no deformities or breast masses noted Breasts:  Breast biopsy site without erythema, exudate or bruising Lungs:  Clear bilaterally to auscultation and percussion. Heart:  Non-displaced PMI, chest non-tender; irregular rate and rhythm, S1, S2 without murmurs, rubs or gallops. Carotid upstroke normal, no bruit. Normal abdominal aortic size (compromised by morbid obesity), no bruits. Femorals normal pulses, no bruits. Pedals normal pulses. Trace bilateral lower extremity edema, no varicosities. Abdomen:  Bowel sounds positive; abdomen soft and non-tender without masses, organomegaly, or hernias noted, obese Msk:  Back normal, normal gait. Muscle strength and tone normal. Extremities:  No clubbing or cyanosis. Neurologic:  Alert and oriented x 3. Skin:  Intact without lesions or rashes. Cervical Nodes:  no significant adenopathy Psych:  Normal affect.   EKG  Procedure date:  07/14/2010  Findings:      Reviewed and reported elsewhere  Impression & Recommendations:  Problem # 1:  SHORTNESS OF BREATH (ICD-786.05) Because of this and the fact that the patient will be exercising more we will order a screening exercise treadmill test Orders: Treadmill (Treadmill)  Problem # 2:  ATRIAL FIBRILLATION (ICD-427.31) She can restart her Pradaxa tonight.  She will remain on this and rate control.  Problem # 3:  OBESITY, UNSPECIFIED (ICD-278.00) I am  extremely proud of her weight loss and I encourage more of the same.  Other Orders: EKG w/ Interpretation (93000)  Patient Instructions: 1)  Your physician recommends that you schedule a follow-up appointment in: 6 months with Dr Antoine Poche 2)  Your physician has recommended you make the following change in your medication: Restart Pradaxa twice a day 3)  Your physician has requested that you have an exercise tolerance test.  For  further information please visit [email or web address]  Please also follow instruction sheet, as given.  This can be done with Tereso Newcomer PA

## 2010-10-17 NOTE — Medication Information (Signed)
Summary: rov/eac  Anticoagulant Therapy  Managed by: Elaina Pattee, PharmD Referring MD: Rollene Rotunda MD PCP: Dr. Leslee Home Supervising MD: Myrtis Ser MD, Tinnie Gens Indication 1: Atrial Fibrillation (ICD-427.31) Lab Used: [facility] Site: [facility] INR POC 2.3 INR RANGE 2 - 3  Dietary changes: no    Health status changes: no    Bleeding/hemorrhagic complications: no    Recent/future hospitalizations: no    Any changes in medication regimen? no    Recent/future dental: no  Any missed doses?: no       Is patient compliant with meds? yes       Allergies (verified): 1)  Cortisone  Anticoagulation Management History:      The patient comes in today for her initial visit for anticoagulation therapy.  Positive risk factors for bleeding include an age of 53 years or older.  The bleeding index is 'intermediate risk'.  Positive CHADS2 values include History of HTN.  Negative CHADS2 values include Age > 38 years old.  The start date was 11/21/2006.  Anticoagulation responsible provider: Myrtis Ser MD, Tinnie Gens.  INR POC: 2.3.  Cuvette Lot#: [id number].  Exp: 07/2010.    Anticoagulation Management Assessment/Plan:      The patient's current anticoagulation dose is Coumadin 5 mg tabs: Take as directed by coumadin clinic..  The target INR is 2.0-3.0.  The next INR is due 08/26/2009.  Anticoagulation instructions were given to patient.  Results were reviewed/authorized by Elaina Pattee, PharmD.  She was notified by Baron Sane, PharmD Candidate.         Prior Anticoagulation Instructions: INR 2.5  Continue taking 1 tablet (5 mg) on Sunday and Thursday and take 1/2 tablet (2.5 mg) all other days.  Return in 4 weeks.  Current Anticoagulation Instructions: INR 2.3  Continue to take 1/2 tablet every day except on Sunday and Thursday take 1 tablet. Recheck INR in 4 weeks.

## 2010-10-20 NOTE — Letter (Signed)
Summary: [facility] Annual Physical   [facility] Annual Physical   Imported By: Earl Many 12/21/2009 18:33:14  _____________________________________________________________________  External Attachment:    Type:   Image     Comment:   External Document

## 2010-11-02 ENCOUNTER — Encounter: Payer: Self-pay | Admitting: Internal Medicine

## 2010-11-08 NOTE — Letter (Signed)
Summary: Colonoscopy Letter  [facility]  [address] [place], [place] [address]   Phone: [phone]  Fax: [phone]      November 02, 2010 MRN: [id number]   [facility] [address] [place], [place]  [address]   Dear Ms. JACOBS,   According to your medical record, it is time for you to schedule a Colonoscopy. The American Cancer Society recommends this procedure as a method to detect early colon cancer. Patients with a family history of colon cancer, or a personal history of colon polyps or inflammatory bowel disease are at increased risk.  This letter has been generated based on the recommendations made at the time of your procedure. If you feel that in your particular situation this may no longer apply, please contact our office.  Please call our office at [phone] to schedule this appointment or to update your records at your earliest convenience.  Thank you for cooperating with [place] to provide you with the very best care possible.   Sincerely,  Hedwig Morton. Juanda Chance, M.D.  [facility] [phone]

## 2010-11-16 ENCOUNTER — Encounter (INDEPENDENT_AMBULATORY_CARE_PROVIDER_SITE_OTHER): Payer: Self-pay | Admitting: *Deleted

## 2010-11-23 NOTE — Letter (Signed)
Summary: New Patient letter  [facility]  [address] [place], [place] [address]   Phone: [phone]  Fax: [phone]       11/16/2010 MRN: [id number]  [name] [name] [address] [place], [place]  [address]  Dear Ms. [name],  Welcome to the Gastroenterology Division at [facility].    You are scheduled to see Dr.  Lina Sar on December 26, 2010 at 1:30pm on the 3rd floor at [facility], [address]  We ask that you try to arrive at our office 15 minutes prior to your appointment time to allow for check-in.  We would like you to complete the enclosed self-administered evaluation form prior to your visit and bring it with you on the day of your appointment.  We will review it with you.  Also, please bring a complete list of all your medications or, if you prefer, bring the medication bottles and we will list them.  Please bring your insurance card so that we may make a copy of it.  If your insurance requires a referral to see a specialist, please bring your referral form from your primary care physician.  Co-payments are due at the time of your visit and may be paid by cash, check or credit card.     Your office visit will consist of a consult with your physician (includes a physical exam), any laboratory testing he/she may order, scheduling of any necessary diagnostic testing (e.g. x-ray, ultrasound, CT-scan), and scheduling of a procedure (e.g. Endoscopy, Colonoscopy) if required.  Please allow enough time on your schedule to allow for any/all of these possibilities.    If you cannot keep your appointment, please call [phone] to cancel or reschedule prior to your appointment date.  This allows [place] the opportunity to schedule an appointment for another patient in need of care.  If you do not cancel or reschedule by 5 p.m. the business day prior to your appointment date, you will be charged a $50.00 late cancellation/no-show fee.    Thank you for  choosing [facility] for your medical needs.  We appreciate the opportunity to care for you.  Please visit [place] at our website  to learn more about our practice.                     Sincerely,                                                             The Gastroenterology Division

## 2010-12-20 ENCOUNTER — Ambulatory Visit
Admission: RE | Admit: 2010-12-20 | Discharge: 2010-12-20 | Disposition: A | Discharge status: Home / Self Care | Payer: Medicare Other | Source: Ambulatory Visit | Attending: Emergency Medicine | Admitting: Emergency Medicine

## 2010-12-20 ENCOUNTER — Other Ambulatory Visit: Payer: Self-pay | Admitting: Emergency Medicine

## 2010-12-20 DIAGNOSIS — M79672 Pain in left foot: Secondary | ICD-10-CM

## 2010-12-26 ENCOUNTER — Encounter: Payer: Self-pay | Admitting: Internal Medicine

## 2010-12-26 ENCOUNTER — Ambulatory Visit (INDEPENDENT_AMBULATORY_CARE_PROVIDER_SITE_OTHER): Payer: Medicare Other | Admitting: Internal Medicine

## 2010-12-26 VITALS — BP 132/80 | HR 68 | Ht 66.0 in | Wt 287.0 lb

## 2010-12-26 DIAGNOSIS — D689 Coagulation defect, unspecified: Secondary | ICD-10-CM

## 2010-12-26 DIAGNOSIS — Z86010 Personal history of colonic polyps: Secondary | ICD-10-CM

## 2010-12-26 NOTE — Progress Notes (Signed)
[name] [name] [birth year] MRN [id number]    History of Present Illness:  This is a 70 year old African American female who is here to discuss having a screening colonoscopy. Her last colonoscopy was in February 2005 and it showed a hyperplastic polyp. There is no family history of colon cancer. She has no complaints such as rectal bleeding or abdominal pain. She has a history of atrial fibrillation. She was initially on Coumadin and most recently on Pradaxa 150 mg twice a day. She has hypothyroidism, high blood pressure, asthma. She is status post a failed cardioversion. She discontinued her Coumadin in June 2010 while having a tooth extraction.   Past Medical History  Diagnosis Date  . Congestive heart failure   . Atrial fibrillation   . Hypothyroidism   . Hypertension   . Dyslipidemia   . Asthma   . Breast cancer     with lymph node resection (16 nodes)  . Diverticulosis   . Hyperplastic colon polyp 2005  . Arthritis   . Gallstones   . Obesity   . Hemorrhoids    Past Surgical History  Procedure Date  . Lymph node dissection     due to breast cancer  . Mole excision   . Breast lumpectomy     left    reports that she has never smoked. She has never used smokeless tobacco. She reports that she does not drink alcohol or use illicit drugs. family history includes Alzheimer's disease in her mother; Breast cancer in her mother; Colon cancer in her maternal aunt; Heart disease in her father; Prostate cancer in her maternal grandfather; and Stroke in her brother. Allergies  Allergen Reactions  . Cortisone     REACTION: weight gain        Review of Systems: Denies diarrhea, constipation, rectal bleeding, abdominal pain.  The remainder of the 10  point ROS is negative except as outlined in H&P   Physical Exam: General appearance  Well developed, in no distress. Eyes- non icteric. HEENT nontraumatic, normocephalic. Mouth no lesions, tongue papillated, no cheilosis. Neck  supple without adenopathy, thyroid not enlarged, no carotid bruits, no JVD. Lungs Clear to auscultation bilaterally. Cor normal S1 normal S2,irregular rhythm , no murmur,  quiet precordium. Abdomen obese, soft, nontender with normoactive bowel sounds. Rectal: Not performed. Extremities no pedal edema. Skin no lesions. Neurological alert and oriented x 3. Psychological normal mood and affect.  Assessment and Plan:  Problems #1: Patient has a history of a hyperplastic colon polyp. Current guidelines suggest a repeat colonoscopy in 10 years. I have discussed the surveillance colonoscopy, weighing the risks and the benefits of the procedure in the setting of anticoagulation. Since she is asymptomatic and has never had adenomatous polyps or a family history of colon cancer, I would recommend a screening colonoscopy interval to be 10 years rather than 7 years to avoid the risk of a thromboembolic event.  Problem #2 anticoagulation. Patient is currently on Pradaxa 150 mg twice a day for chronic atrial fibrillation.   12/26/2010 Lina Sar

## 2010-12-26 NOTE — Patient Instructions (Signed)
You will be due for a colonoscopy around 10/2013. We will send you a letter in the mail when it gets closer to that time.

## 2010-12-29 ENCOUNTER — Encounter: Payer: Self-pay | Admitting: Internal Medicine

## 2011-01-30 NOTE — Assessment & Plan Note (Signed)
[name]                            CARDIOLOGY OFFICE NOTE   NAME:[name], [name] ORSER                       MRN:          [id number]  DATE:12/30/2007                            DOB:          11/21/1940    PRIMARY CARE PHYSICIAN:  Reuben Likes, M.D.   REASON FOR PRESENTATION:  Evaluate patient with atrial fibrillation.   HISTORY OF PRESENT ILLNESS:  The patient is 70 years old.  She presents  for evaluation of atrial fibrillation.  She is not noticing any  palpitations.  She has no presyncope or syncope.  She has no chest  discomfort, neck or arm discomfort.  She is trying to do some walking.  She is trying to do some dieting.  With this, she notices no  cardiovascular symptoms.  At the last visit, the plan was to get a 48-  hour Holter monitor upon return to make sure she had reasonable rate  control.  She is tolerating Coumadin.   PAST MEDICAL HISTORY:  1. Atrial fibrillation, persistent and recurrent (status post      cardioversion).  2. Hypothyroidism.  3. Hypertension.  4. Dyslipidemia.  5. Asthma, breast cancer with lumpectomy and 33 radiation treatments      1997 and 1998, 16 lymph nodes resected, mole resected, cyst      removed.   ALLERGIES AND INTOLERANCES:  PREDNISONE, CORTISONE.   MEDICATIONS:  1. Coumadin.  2. Cardizem 120 mg daily.  3. Hydrochlorothiazide 12.5 mg daily.  4. Pindolol 2.5 mg daily.   REVIEW OF SYSTEMS:  As stated in HPI and otherwise negative for other  systems.   PHYSICAL EXAMINATION:  GENERAL APPEARANCE:  The patient is in no  distress.  VITAL SIGNS:  Blood pressure 139/92, heart rate 82 and regular, weight  296 pounds, body mass index 47.  HEENT:  Eye lids unremarkable.  Pupils equal, round and reactive to  light.  Fundi not visualized.  Oral mucosa unremarkable.  NECK:  No jugular venous distension at 45 degrees.  Carotid upstroke  brisk and symmetrical.  No bruits, no thyromegaly.  LYMPHATICS:  No  lymphadenopathy.  LUNGS:  Clear to auscultation bilaterally.  BACK:  No costovertebral angle tenderness.  CHEST:  Unremarkable.  HEART:  PMI not displaced or sustained, S1 and S2 within normal limits.  No S3, no murmurs.  ABDOMEN:  Obese, positive bowel sounds, normal in frequency and pitch,  no bruits, rebound, guarding or midline pulsatile mass, organomegaly.  SKIN:  No rashes, no nodules.  EXTREMITIES:  2+ pulse, no edema.   EKG:  Atrial fibrillation with a ventricular rate 97, axis within normal  limits, intervals within normal limits, no acute ST wave change.   ASSESSMENT/PLAN:  1. Atrial fibrillation.  The patient is tolerating Coumadin.  She has      been on a stable dose of pindolol since last visit.  At this point,      I will apply a 48-hour Holter monitor to make sure she has      reasonable rate control.  Further evaluation based on this.  2. Obesity.  We have again had long discussions about the need to lose      weight with diet and exercise and she seems to have a plan.      Hopefully, she can continue with weight loss that she was able to      achieve last year.  3. Hypothyroidism.  The patient has had this followed by Dr. Chestine Spore.      She has recently had a workup to include an ultrasound and thyroid      studies.  Further evaluation will be with Dr. Chestine Spore and Dr. Lorenz Coaster.  4. Hypertension.  Blood pressure is well-controlled.  She will      continue medications as listed.  5. Dyslipidemia per her primary care physician.  The goal would be for      primary prevention.  6. Follow-up.  See the patient back in about six months or sooner if      needed.     Rollene Rotunda, MD, [facility]  Electronically Signed    JH/MedQ  DD: 12/30/2007  DT: 12/30/2007  Job #: [id number]   cc:   Reuben Likes, M.D.

## 2011-01-30 NOTE — Assessment & Plan Note (Signed)
[facility]                            CARDIOLOGY OFFICE NOTE   NAME:[name] [name], [name] [name]                       MRN:          [id number]  DATE:05/02/2007                            DOB:          1941/01/29    PRIMARY CARE PHYSICIAN:  Dr. Leslee Home.   REASON FOR PRESENTATION:  Evaluate patient with atrial fibrillation.   HISTORY OF PRESENT ILLNESS:  The patient returns for followup.  She is  70 years old.  Since I saw her she has been traveling.  She has done  quite well with her activities while she has been traveling.  She has  not noticed any significant palpitations.  She has had no presyncope or  syncope.  She has only had minimal shortness of breath.  She denies any  resting shortness of breath and has no PND or orthopnea.  She denies any  chest discomfort, neck or arm discomfort.   PAST MEDICAL HISTORY:  1. Atrial fibrillation, persistent and recurrent, status post      cardioversion.  2. Hypothyroidism.  3. Hypertension.  4. Hyperlipidemia.  5. Asthma.  6. Breast cancer with lumpectomy and 33 radiation treatments from 1997      to 1998.  7. Sixteen lymph nodes resected.  8. Mole resected.  9. Cyst removal.   ALLERGIES:  PREDNISONE, CORTISONE.   MEDICATIONS:  1. Metoprolol 50 mg daily.  2. Coumadin.  3. ProAir.  4. Beconase.  5. Cardizem 120 mg daily.   REVIEW OF SYSTEMS:  As stated in the HPI and otherwise negative for  other systems.   PHYSICAL EXAMINATION:  The patient is in no distress.  Blood pressure 159/100, heart rate 80s and irregular.  HEENT:  Eyelids unremarkable, pupils are equal, round, and reactive to  light, fundi not visualized.  Oral mucosa unremarkable.  NECK:  No jugular venous distension at 45 degrees, carotid upstroke  brisk and symmetrical.  No bruit.  No thyromegaly.  LYMPHATICS:  No cervical, axillary, inguinal adenopathy.  LUNGS:  Clear to auscultation bilaterally.  BACK:  No costovertebral angle  tenderness.  CHEST:  Unremarkable.  HEART:  PMI not displaced or sustained.  S1 and S2 are within normal  limits.  No S3, no murmurs.  ABDOMEN:  Obese, positive bowel sounds, normal in frequency and pitch.  No bruits, no rebound, no guarding.  No midline pulsatile mass.  No  hepatomegaly, no splenomegaly.  SKIN:  No rashes, no nodules.  EXTREMITIES:  2+ pulses, no edema.  No cyanosis, no clubbing.  NEURO:  Oriented to person, place, and time.  Cranial nerves II-XII  grossly intact, motor grossly intact.   ASSESSMENT AND PLAN:  1. Atrial fibrillation:  The patient is in persistent atrial      fibrillation, though she is not particularly symptomatic with this.      She is tolerating Coumadin.  At this point, I will put another      Holter monitor on her to make sure she has adequate rate control.      The plan is rate control and anticoagulation.  2. Hypertension:  Blood pressure is elevated today.  It was not      elevated at the last visit.  She will continue to have this      monitored.  She may need adjustment if she continues to have these      readings.  She should have weight loss, which, hopefully, will help      her come to target better.  3. Followup:  Will be 6 months or sooner if needed.     Rollene Rotunda, MD, [facility]  Electronically Signed    JH/MedQ  DD: 05/02/2007  DT: 05/02/2007  Job #: [id number]   cc:   Reuben Likes, M.D.

## 2011-01-30 NOTE — Assessment & Plan Note (Signed)
[facility]                            CARDIOLOGY OFFICE NOTE   NAME:[name] [name] BYERS                       MRN:          [id number]  DATE:07/15/2008                            DOB:          [birth year]    PRIMARY CARE PHYSICIAN:  [name] Likes, MD   REASON FOR PRESENTATION:  Evaluate the patient with atrial fibrillation.   HISTORY OF PRESENT ILLNESS:  The patient is 71 years old.  The patient  returns for followup of the above.  Since I last saw her, she has done  well.  She has had no new palpitations, no presyncope, or syncope.  She  says that when she takes a second dose of pindolol she feels a little  jittery.  However, she thinks she has felt better than she has in  years.  She has been unable to lose weight despite [employer].  In  fact, she is up a few pounds.  She thinks this is fluid and related to  her medications.  She is not having any new shortness of breath.  Denies  any PND or orthopnea.  She has not been having any chest pressure, neck  or arm discomfort.   PAST MEDICAL HISTORY:  Atrial fibrillation persistent and recurrent  (status post cardioversion), hypothyroidism, hypertension, dyslipidemia,  asthma, breast cancer with lumpectomy and 33 radiation treatments in  1997 and in 1998, 16 lymph nodes resected, mole resected, cyst removed.   ALLERGIES AND INTOLERANCE:  PREDNISONE and CORTISONE.   MEDICATIONS:  1. Coumadin.  2. Cardizem 120 mg daily.  3. Hydrochlorothiazide 12.5 mg daily.  4. Pindolol 2.5 mg b.i.d.   REVIEW OF SYSTEMS:  As stated in the HPI and otherwise negative for  other systems.   PHYSICAL EXAMINATION:  GENERAL:  The patient is pleasant and in no  distress.  VITAL SIGNS:  Weight 301 pounds, body mass index 48.  Blood pressure  137/80,  heart rate 75 and irregular.  NECK:  No jugular venous distention at 45 degrees, carotid upstroke  brisk and symmetrical, no bruits, no thyromegaly.  LUNGS:  Clear to  auscultation bilaterally.  CHEST:  Unremarkable.  HEART:  PMI not displaced or sustained, S1 and S2 within normal, no S3,  no S4, no clicks, no rubs, no murmurs.  ABDOMEN:  Morbidly obese, positive bowel sounds normal in frequency and  pitch, no bruits, no rebound, no guarding or midline pulsatile mass, no  organomegaly.  EXTREMITIES:  2+ pulse throughout, no edema.   EKG atrial fibrillation, axis within normal limits, intervals within  normal limits, low voltage in the precordial leads, nonspecific ST  changes.   ASSESSMENT AND PLAN:  1. Atrial fibrillation.  The patient has reasonable rate control.  She      is tolerating the Coumadin.  She is not symptomatic with this      rhythm.  At this point, no further cardiovascular testing is      suggested.  2. Obesity.  I wish for her more luck and losing weight.  She may have      some fluid  retention related to her meds, this is clearly not a      bulk of the problem.  She denied to discuss this.  3. Followup.  I will see her back in 1 year or sooner if needed.     Rollene Rotunda, MD, [facility]  Electronically Signed    JH/MedQ  DD: 07/15/2008  DT: 07/15/2008  Job #: [id number]   cc:   [name] [name], M.D.

## 2011-01-30 NOTE — Assessment & Plan Note (Signed)
[facility]                            CARDIOLOGY OFFICE NOTE   NAME:[name] [name], [name] [name]                       MRN:          [id number]  DATE:05/29/2007                            DOB:          January 09, 1941    PRIMARY CARE PHYSICIAN:  Reuben Likes, M.D.   REASON FOR PRESENTATION:  Evaluate patient with atrial fibrillation.   HISTORY OF PRESENT ILLNESS:  The patient is a pleasant 70 year old  African American female.  Since the last visit, I did put a Holter  monitor on her.  This demonstrated that she has atrial fibrillation with  brady rates during the resting hours.  She is somewhat tachycardic with  activity.  She is not noticing any palpitations.  She says she actually  feels better.  She is not having any dyspnea.  She has been having some  intestinal problems and thinks she has an intestinal infection.  She has  been having some back pain related to this.  Of note, her blood pressure  has been elevated recently.  Dr. Lorenz Coaster (who I spoke to on the phone  today) increased her Cardizem to 240 mg daily and added  hydrochlorothiazide 25 mg daily.   PAST MEDICAL HISTORY:  1. Atrial fibrillation, persistent and recurrent, status post      cardioversion.  2. Hypothyroidism.  3. Hypertension.  4. Dyslipidemia.  5. Asthma.  6. Breast cancer with lumpectomy and 33 radiation treatments from 1997-      1998, 16 lymph nodes resected, mole resected, cyst removal.   ALLERGIES:  1. PREDNISONE.  2. CORTISONE.   MEDICATIONS:  1. Metoprolol 50 mg daily.  2. Coumadin 5 mg daily.  3. Cardizem 240 mg daily.  4. Hydrochlorothiazide 25 mg daily (she has not started this yet).   REVIEW OF SYSTEMS:  As stated in the HPI and otherwise negative for  other systems.   PHYSICAL EXAMINATION:  GENERAL:  The patient is in no distress.  VITAL SIGNS:  Blood pressure 144/80, heart rate 100 and irregular,  weight 290 pounds.  NECK:  No jugular venous distention at 45  degrees.  Carotid upstroke  brisk and symmetric.  No bruits.  No thyromegaly.  LYMPHATICS:  No cervical, axillary, or inguinal adenopathy.  LUNGS:  Clear to auscultation bilaterally.  BACK:  No costovertebral angle tenderness.  CHEST:  Unremarkable.  HEART:  PMI not displaced or sustained.  S1 and S2 within normal limits.  No S3, no murmurs.  ABDOMEN:  Obese.  Positive bowel sounds, normal in frequency and pitch.  No bruits.  No rebound.  No guarding.  No midline pulsatile mass.  No  hepatomegaly. No splenomegaly.  SKIN:  No rashes.  No nodules.  EXTREMITIES:  Two plus pulses.  No edema.  No cyanosis.  No clubbing.  NEUROLOGIC:  Grossly intact.   EKG:  Atrial fibrillation, rate 100s, axis within normal limits, low  voltage on the chest leads, premature ectopic complex, nonspecific T  wave changes, baseline artifact.   ASSESSMENT/PLAN:  1. Atrial fibrillation.  The patient has some tachybrady rates.  She  had her Cardizem increased recently for blood pressure control.  I      am going to change her from metoprolol to pindolol 2.5 mg twice a      day as I am afraid she might have more brady arrhythmias with the      increased Cardizem.  I will probably apply another monitor in the      near future to see how we are doing with this tachybrady syndrome.      She is not having any symptoms at this point.  She will maintain      Coumadin.  2. Obesity.  She is loosing weight and I applaud this.  3. Hypertension.  This will be managed per Dr. Lorenz Coaster as above.   FOLLOWUP:  I will see her back in 3 months.     Rollene Rotunda, MD, [facility]  Electronically Signed    JH/MedQ  DD: 05/29/2007  DT: 05/29/2007  Job #: [id number]   cc:   Reuben Likes, M.D.

## 2011-01-30 NOTE — Op Note (Signed)
NAMEFRANCIS, YARDLEY                ACCOUNT NO.:  [id number]   MEDICAL RECORD NO.:  [id number]          PATIENT TYPE:  OIB   LOCATION:  2899                         FACILITY:  [facility]   PHYSICIAN:  Rollene Rotunda, MD, FACCDATE OF BIRTH:  21-Apr-1941   DATE OF PROCEDURE:  02/03/2007  DATE OF DISCHARGE:                               OPERATIVE REPORT   PRIMARY CARE PHYSICIAN:  Dr. Barrie Lyme.   PROCEDURE:  Direct current cardioversion.   PROCEDURAL NOTE:  The patient had direct current cardioversion for  symptomatic atrial fibrillation.  She has signed appropriate informed  consent.  She had been on therapeutic Coumadin for a 4 weeks.  Anesthesiology was present for airway maintenance.  The patient was  monitored and was hemodynamically stable and oxygenating well throughout  the procedure.  She was given 275 mg of Pentothal per anesthesia.  She  was successfully cardioverted x1 with 150 joules of biphasic  synchronized energy.  This resulted in normal sinus rhythm.  EKG is  pending.   PLAN:  The patient will continue her Coumadin.  She will decrease  Cardizem to 120 mg but remain on a beta blocker.  I will follow her on  June 2 at 4:00 p.m.Rollene Rotunda, MD, [facility]  Electronically Signed     JH/MEDQ  D:  02/03/2007  T:  02/03/2007  Job:  [id number]   cc:   Barrie Lyme, M.D.

## 2011-01-30 NOTE — Assessment & Plan Note (Signed)
[facility]                            CARDIOLOGY OFFICE NOTE   NAME:[name] [name], [name] [name]                       MRN:          [id number]  DATE:02/17/2007                            DOB:          18-Jun-1941    PRIMARY CARE PHYSICIAN:  Reuben Likes, M.D.   REASON FOR VISIT:  Evaluate patient with atrial fibrillation.   HISTORY OF PRESENT ILLNESS:  The patient returns for followup, status  post cardioversion on May 19.  She was in sinus rhythm when she left the  hospital.  It is not clear how different she felt.  She thought  immediately coming off the table she had more energy.  She has not  noticed significant palpitations like she was having.  She has not been  doing as much.  However, she thought her energy level was a little bit  better.  She has not had any presyncope or syncope.  She has not had any  chest pain.   Today, in the office, she is back in atrial fibrillation.  She was  surprised about this.   PAST MEDICAL HISTORY:  1. Hypothyroidism.  2. Hypertension.  3. Hyperlipidemia.  4. Asthma.  5. Breast cancer with lumpectomy and 33 radiation treatments from 1997      to 1998, 16 lymph nodes resected, mole resected, cyst removal.   ALLERGIES:  PREDNISONE AND CORTISONE.   MEDICATIONS:  1. Metoprolol 50 mg daily.  2. Coumadin 5 mg daily.  3. Cardizem 120 mg daily.   REVIEW OF SYSTEMS:  As stated in the HPI, otherwise negative for other  systems.   PHYSICAL EXAMINATION:  GENERAL:  The patient is in no distress.  VITAL SIGNS:  Weight 291 pounds, body mass index 46, blood pressure  140/80, heart rate 84 and irregular.  NECK:  No jugular venous distention, wave form within normal limits.  Carotid upstroke brisk and symmetrical, no bruits, no thyromegaly.  LYMPHATICS:  No lymphadenopathy.  LUNGS:  Clear to auscultation bilaterally.  BACK:  No costovertebral angle tenderness.  CHEST:  Unremarkable.  HEART:  PMI not displaced or  sustained.  S1, S2 within normal limits.  No S3, no S4, clicks, rubs or murmurs.  ABDOMEN:  Obese, positive bowel sounds, normal in frequency and pitch.  No bruits, no guarding, no rebound, no midline pulsatile mass, no  hepatomegaly, no splenomegaly.  SKIN:  No rashes.  EXTREMITIES:  2+ pulses, no edema.   ASSESSMENT/PLAN:  1. Atrial fibrillation.  The patient is back in atrial fibrillation.      It is not clear at all to me that this is making much of a      difference symptomatically.  For now, I am going to continue with      rate control and anticoagulation.  I have encouraged her to get      back to her usual routine.  She will let me know over time whether      she is unable to do this because of any limitations such as dyspnea      or fatigue.  I  plan to re-evaluate her in 2 months to see if there      is any strong indication to try rhythm control.  2. Obesity.  She understands she needs to lose weight with diet and      exercise.  We have talked about this in the past.  3. Followup.  We will see her back in 2 months or sooner if needed.     Rollene Rotunda, MD, [facility]  Electronically Signed    JH/MedQ  DD: 02/17/2007  DT: 02/18/2007  Job #: [id number]   cc:   Reuben Likes, M.D.

## 2011-01-30 NOTE — Assessment & Plan Note (Signed)
[facility]                            CARDIOLOGY OFFICE NOTE   NAME:[name] [name], [name] [name]                       MRN:          [id number]  DATE:07/04/2007                            DOB:          06-18-1941    PRIMARY CARE PHYSICIAN:  Reuben Likes, M.D.   REASON FOR PRESENTATION:  Evaluate patient with atrial fibrillation.   HISTORY OF PRESENT ILLNESS:  The patient returns to followup of the  above.  Since I last saw her, she had seen Dr. Lorenz Coaster and has actually  had her dose of Cardizem and pindolol decreased.  Her blood pressure has  been down.  She says she feels much better since her blood pressure has  been down.  She is not feeling like her eyes are going to bulge out of  her head, when she bends over.  She is not having any presyncope or  syncope.  She is not noticing any palpitations.  She is not having any  chest discomfort, neck or arm discomfort.  She is not having new  shortness of breath.  She denies any PND or orthopnea.  She said she did  not feel well when she was taking 2.5 mg of pindolol twice a day, so  this dose was reduced in half.  She says she felt like her heart was  thumping then.   PAST MEDICAL HISTORY:  1. Atrial fibrillation persistent and recurrent, status post      cardioversion.  2. Hypothyroidism.  3. Hypertension.  4. Dyslipidemia.  5. Asthma.  6. Breast cancer with lumpectomy and 33 radiation treatments from 1997      to 1998.  7. Sixteen lymph nodes resected.  8. Mole resected.  9. Cyst removed.   ALLERGIES:  1. PREDNISONE.  2. CORTISONE.   MEDICATIONS:  1. Coumadin.  2. Cardizem 120 mg daily.  3. Hydrochlorothiazide 12.5 mg daily.  4. Pindolol 1.25 mg b.i.d.   REVIEW OF SYSTEMS:  As stated in the HPI and otherwise negative for  other systems.   PHYSICAL EXAMINATION:  GENERAL:  The patient is in no distress.  VITAL SIGNS:  Blood pressure 118/90, heart rate 84 and irregular, weight  291 pounds.  HEENT:  Eyelids unremarkable.  Pupils are equal, round, and reactive to  light.  Fundi not visualized.  Oral mucosa unremarkable.  NECK:  No jugular venous distention at 45 degrees.  Carotid upstroke  brisk and symmetric.  No bruits.  No thyromegaly.  LYMPHATICS:  No  adenopathy.  LUNGS:  Clear to auscultation bilaterally.  BACK:  No costovertebral angle tenderness.  CHEST:  Unremarkable.  HEART:  PMI not displaced or sustained.  S1 and S2 within normal limits.  No S3, no murmurs.  ABDOMEN:  Obese.  Positive bowel sounds, normal in frequency and pitch.  No bruits.  No rebound.  No guarding.  No midline pulsatile mass.  No  hepatomegaly.  No splenomegaly.  SKIN:  No rashes.  No nodules.  EXTREMITIES:  Two plus pulses.  No edema.   ASSESSMENT/PLAN:  1. Atrial fibrillation.  The patient seems to  be doing well and not      having much in the way of symptoms at this point.  At this point,      we will continue with Coumadin and rate control.  I am going to see      her back in 6 months at which point I will order a Holter monitor.  2. Hypertension, per Dr. Lorenz Coaster.  She seems to be on a stable regimen.  3. Obesity.  She is in Weight Watchers and says she has lost 20 pounds      (my scales do not corroborate this).  I applaud her efforts and      encourage more of the same.     Rollene Rotunda, MD, [facility]  Electronically Signed    JH/MedQ  DD: 07/04/2007  DT: 07/06/2007  Job #: [id number]   cc:   Reuben Likes, M.D.

## 2011-02-02 NOTE — Assessment & Plan Note (Signed)
[facility]                                 ON-CALL NOTE   NAME:JACOBSJaquelyne, [name] [name]                         MRN:          [id number]  DATE:11/29/2006                            DOB:          28-Sep-1940    I received a telephone call through the answering service on November 29, 2006 at 1818 hours.  Attending physician is Dr. Charlies Constable.  Answering  service states [facility] is calling at [phone] concerning [name] who is a patient of Dr. Jenene Slicker for verification of  medications.  I called the pharmacy phone number as stated above.  The  pharmacist stated she was filling a prescription for Cardizem for this  patient of Dr. Jenene Slicker and wanted to confirm the Cardizem 120 mg  ordered p.o. daily was the long acting dose.  I told her the long acting  dose would be appropriate.  She states she would fill the prescription  and relay the information to [name].      Dorian Pod, ACNP  Electronically Signed      Everardo Beals. Juanda Chance, MD, [facility]  Electronically Signed   MB/MedQ  DD: 11/29/2006  DT: 12/01/2006  Job #: [id number]

## 2011-02-02 NOTE — Assessment & Plan Note (Signed)
[facility]                            CARDIOLOGY OFFICE NOTE   NAME:[name], [name] WALLMAN                       MRN:          [id number]  DATE:12/13/2006                            DOB:          1940/10/21    PRIMARY:  Dr. Leslee [name].   REASON FOR PRESENTATION:  Evaluate patient with atrial fibrillation.   HISTORY OF PRESENT ILLNESS:  The patient returns for followup of the  above.  At the last appointment she was started on Cardizem 120 mg daily  with her rapid AFib.  Since then she has seen Dr. Lorenz [name].  Because of  her blood pressure, and I suspect heart rate, he increased this 240 mg a  day.  She has been reluctant to take this as she thinks she feels a  little lightheaded or has a headache with this.  However, this morning  after taking only 120 mg her heart rate ambulating around the office  went up to 130 relatively easily.  She is not feeling this as much and  does not even think she is in the rhythm though she clearly is.  She  does not have any presyncope or syncope.  She is not having any chest  pain or shortness of breath.   Of note, the patient had an echocardiogram a few days ago.  It  demonstrated the EF to be 50-55%.  There were no wall motion  abnormalities, no significant valvular abnormalities.  She had some mild  mitral valve regurgitation.  There was a moderately dilated left atrium.   PAST MEDICAL HISTORY:  1. Hypothyroidism.  2. Hypertension.  3. Hyperlipidemia.  4. Asthma.  5. Breast cancer.  6. Lumpectomy with 33 radiation treatments 1997 and 1998, 16 lymph      nodes resected, mole resected, cyst removal.   ALLERGIES:  1. PREDNISONE.  2. CORTISONE.   MEDICATIONS:  1. Metoprolol 50 mg daily.  2. Coumadin 5 mg daily.  3. Cardizem 240 mg daily.  4. ProAir.  5. Beconase.   REVIEW OF SYSTEMS:  As stated in the HPI and otherwise negative for  other systems.   PHYSICAL EXAMINATION:  The patient is in no distress.   Blood pressure  145/80, heart rate 70 and irregular, weight 293 pounds.  HEENT:  Eyes unremarkable, pupils equal, round and react to light, fundi  not visualized, oral mucosa unremarkable.  NECK:  No jugular venous distention, wave form within normal limits,  carotid upstroke brisk and symmetric, no bruits, no thyromegaly.  LYMPHATICS:  No adenopathy.  LUNGS:  Clear to auscultation bilaterally.  BACK:  No costovertebral angle tenderness.  CHEST:  Unremarkable.  HEART:  The PMI not displaced or sustained, S1 and S2 within normal  limits, no S3, no S4, no clicks, no rubs, no murmurs.  ABDOMEN:  Obese, positive bowel sounds normal in frequency and pitch, no  bruits, no rebound, no guarding, no midline pulses, no masses, no  cardiomegaly.  SKIN:  No rashes, no nodules.  EXTREMITIES:  Pulses 2+, no edema.  NEURO:  Grossly intact.   ASSESSMENT AND PLAN:  1. Atrial fibrillation.  The patient does have a rapid rate but only      took 120 mg of her Cardizem.  I have encouraged her to take the 240      mg.  She wants to split this dose and so I will rewrite the      prescription for 120 XL b.i.d.  I am going to place a 48 hour      Holter monitor.  If she is in persistent atrial fibrillation, I      will plan cardioversion once she has been on therapeutic      anticoagulation.  If it is paroxysmal, will persist with rate      control and anticoagulation.  2. Followup.  I will see her back based on the results of the Holter,      which may actually be at the time of cardioversion.     Rollene Rotunda, MD, [facility]  Electronically Signed    JH/MedQ  DD: 12/13/2006  DT: 12/13/2006  Job #: [id number]   cc:   [name] [name], M.D.

## 2011-02-02 NOTE — Assessment & Plan Note (Signed)
[place] [facility]                            CARDIOLOGY OFFICE NOTE   NAME:[name] [name] [name]                       MRN:          [id number]  DATE:11/27/2006                            DOB:          10-20-1940    REASON FOR REFERRAL:  Evaluate patient with atrial fibrillation.   HISTORY OF PRESENT ILLNESS:  The patient is a lovely 70 year old African  American female with no prior cardiac history.  She has had palpitations  for years.  She noticed these late last week.  The symptoms she has been  having for years.  She could feel her heart racing in her ears.  She  will get sweats.  She does get a little jaw tightness.  She gets mildly  short of breath.  She does not get presyncopal and has never had a  syncopal spell with it.  She thinks it comes on when she is rushing or  anxious.  She did see Dr. Lorenz Coaster on March 7 and was noted to be in  atrial fibrillation with a rapid ventricular rate.  She was put on  Toprol XL.  She was also started on Coumadin.  Labs included a normal  CMET and TSH.   The patient does not describe chest pressure or arm discomfort.  She  does not have any resting shortness of breath unless her palpitations  are rapid and has no PND or orthopnea.   PAST MEDICAL HISTORY:  1. Hypothyroidism (THE PATIENT SAYS SHE HAS BEEN INTOLERANT OF      SYNTHROID AND SHE IS CURRENTLY NOT TAKING THIS).  2. Hypertension.  3. Hyperlipidemia.  4. Asthma.  5. Breast cancer.   PAST SURGICAL HISTORY:  1. Lumpectomy with 33 radiation treatments 1997-1998.  2. Sixteen lymph nodes resected.  3. Mole resected.  4. Cyst removal.   ALLERGIES:  PREDNISONE, CORTISONE (ALL THE SONES).   MEDICATIONS:  1. ProAir.  2. Beconase.  3. Metoprolol ER 50 mg daily.  4. Coumadin.   SOCIAL HISTORY:  The patient is retired.  She is single.  She lives  alone.  She never smoked cigarettes and does not drink alcohol.   FAMILY HISTORY:  Noncontributory for early  coronary artery disease,  cardiomyopathy, or atrial fibrillation.   REVIEW OF SYSTEMS:  As stated in the HPI and positive for previous  goiter, joint pains, lower extremity edema, varicose veins.  Negative  for other systems.   PHYSICAL EXAMINATION:  The patient is in no distress.  Weight 295  pounds, body mass index 46, blood pressure 162/108, heart rate 120 and  irregular.  HEENT:  Eyelids unremarkable, pupils equally round and reactive to  light, fundi not visualized, oral mucosa unremarkable.  NECK:  No jugular venous distention, wave form within normal limits,  carotid upstroke brisk and symmetrical, no bruits, no thyromegaly.  LYMPHATICS:  No cervical, axillary, inguinal adenopathy.  LUNGS:  Clear to auscultation bilaterally.  BACK:  No costovertebral angle tenderness.  CHEST:  Unremarkable.  HEART:  PMI not displaced or sustained, S1 and S2 within normal limits,  no S3, no murmurs, distant  heart sounds.  ABDOMEN:  Morbidly obese, positive bowel sounds normal in frequency and  pitch, no bruits, no rebound, no guarding, no midline pulsatile mass, no  hepatomegaly, splenomegaly.  SKIN:  No rashes, no nodules.  EXTREMITIES:  With 2+ pulses throughout, no edema, no cyanosis, no  clubbing.  NEURO:  Oriented to person, place and time, cranial nerves II-XII  grossly intact, motor grossly intact throughout.   EKG:  Atrial fibrillation with rapid ventricular rate, axis within  normal limits, incomplete right bundle branch block, nonspecific T wave  changes inferior and lateral leads.   ASSESSMENT/PLAN:  1. Atrial fibrillation.  The patient has atrial fibrillation with some      coarse features.  I do not think this is an ablatable flutter.  For      now I agree with Dr. Melanee Spry plan of anticoagulation and rate      control.  I am going to add Cardizem 120 mg daily (she has a      history of asthma so I will avoid going up on the beta blocker).      She is going to be seen in our  Coumadin clinic today and we can      start following this.  I will check an echocardiogram.  Eventually      I am going to put a monitor on her to see if she is having      paroxysms of atrial fibrillation or chronic atrial fibrillation.      If her rhythm is slow and she does not notice it and is having      paroxysms then no further therapy would probably be warranted.  She      is on the borderline for Coumadin and I will reassess the need for      this based on her echocardiogram.  However, with her hypertension I      think that puts her at a little higher risk.  2. Hypertension.  This will be managed in the context of treating her      atrial fibrillation as well.  3. Followup.  She is going to come back for echocardiogram and I will      see her back in a couple of weeks to assess rate control and judge      the timing of further therapy.  I will also      consider cardioversion in the future based predominantly on      symptoms or difficulty with rate control.     Rollene Rotunda, MD, [facility]  Electronically Signed    JH/MedQ  DD: 11/27/2006  DT: 11/29/2006  Job #: [id number]   cc:   Reuben Likes, M.D.

## 2011-02-02 NOTE — Assessment & Plan Note (Signed)
[facility]                            CARDIOLOGY OFFICE NOTE   NAME:[name] [name] GIEBLER                       MRN:          [id number]  DATE:01/02/2007                            DOB:          03-09-1941    PRIMARY CARE PHYSICIAN:  Dr. Lorenz Coaster   REASON FOR PRESENTATION:  Evaluate patient with atrial fibrillation.   HISTORY OF PRESENT ILLNESS:  The patient returns for followup.  Since I  last saw her I did apply a 48 hour monitor, demonstrated that she was in  persistent atrial fibrillation.  She had a reasonably controlled rate.  She has been on Coumadin and tolerating this.  She denies any severe  palpitations.  She has had no presyncope or syncope.  She has had no new  shortness of breath.  She denies any PND or orthopnea.  She denies any  chest pain.  She is trying to walk for exercise a little bit more and is  doing Weight Watchers.   PAST MEDICAL HISTORY:  Hypothyroidism, hypertension, hyperlipidemia,  asthma, breast cancer with lumpectomy and 33 radiation treatments from  1997 to 1998, 16 lymph nodes resected, mole resected, cyst removal.   ALLERGIES:  PREDNISONE and CORTISONE   CURRENT MEDICATIONS:  1. Metoprolol 50 mg daily.  2. Coumadin 5 mg daily.  3. ProAir.  4. Beconase.  5. Cardizem 240 mg daily.   REVIEW OF SYSTEMS:  As stated in the HPI. Otherwise negative for other  systems.   PHYSICAL EXAMINATION:  The patient is in no acute distress.  She is  pleasant.  Blood pressure 157/93, heart rate 84 and regular.  Weight 292 pounds.  Body mass index 46.  HEENT:  Eye lids unremarkable.  Pupils equal, round and reactive to  light, fundi not visualized.  Oral mucosa unremarkable.  NECK:  No jugular venous distention at 45 degrees, carotid upstroke  brisk and symmetric. No bruits.  No thyromegaly.  LYMPHATICS:  No adenopathy.  LUNGS:  Clear to auscultation bilaterally.  BACK:  No costovertebral angle tenderness.  CHEST:  Unremarkable.  HEART:  PMI not displaced or sustained.  S1, S2 within normal limits.  No S3, no murmurs.  ABDOMEN:  Obese, positive bowel sounds, normal in frequency and pitch.  No bruits, no rebound, no guarding, no midline pulsatile mass.  No  hepatosplenomegaly, organomegaly.  SKIN:  No rashes, no nodules.  EXTREMITIES:  2+ pulses, trace bilateral lower extremity edema, varicose  veins in low extremities.  NEURO:  Oriented to person, place and time.  Cranial nerves II through  XII grossly intact, motor grossly intact.   EKG atrial fibrillation with a well controlled ventricular rate, no  acute ST wave changes.   ASSESSMENT AND PLAN:  1. Atrial fibrillation.  The patient has had therapeutic Coumadin      levels.  We are going to arrange cardioversion.  If this fails we      will continue with rate control and anticoagulation.  2. Hypertension.  Blood pressure is slightly elevated.  I will defer      until her cardioversion to further  address this.     Rollene Rotunda, MD, [facility]  Electronically Signed    JH/MedQ  DD: 01/02/2007  DT: 01/02/2007  Job #: [id number]   cc:   [name] [name], M.D.

## 2011-02-06 ENCOUNTER — Encounter (INDEPENDENT_AMBULATORY_CARE_PROVIDER_SITE_OTHER): Payer: Self-pay | Admitting: Surgery

## 2011-02-09 ENCOUNTER — Other Ambulatory Visit: Payer: Self-pay | Admitting: Cardiology

## 2011-08-29 ENCOUNTER — Encounter (INDEPENDENT_AMBULATORY_CARE_PROVIDER_SITE_OTHER): Payer: Self-pay | Admitting: Surgery

## 2011-08-31 ENCOUNTER — Other Ambulatory Visit: Payer: Self-pay | Admitting: Cardiology

## 2011-10-04 DIAGNOSIS — E119 Type 2 diabetes mellitus without complications: Secondary | ICD-10-CM | POA: Diagnosis not present

## 2011-10-04 DIAGNOSIS — I4891 Unspecified atrial fibrillation: Secondary | ICD-10-CM | POA: Diagnosis not present

## 2011-10-04 DIAGNOSIS — E785 Hyperlipidemia, unspecified: Secondary | ICD-10-CM | POA: Diagnosis not present

## 2011-10-04 DIAGNOSIS — M109 Gout, unspecified: Secondary | ICD-10-CM | POA: Diagnosis not present

## 2011-10-18 DIAGNOSIS — H2510 Age-related nuclear cataract, unspecified eye: Secondary | ICD-10-CM | POA: Diagnosis not present

## 2011-11-07 ENCOUNTER — Ambulatory Visit (INDEPENDENT_AMBULATORY_CARE_PROVIDER_SITE_OTHER): Payer: Medicare Other | Admitting: Surgery

## 2011-11-07 ENCOUNTER — Encounter (INDEPENDENT_AMBULATORY_CARE_PROVIDER_SITE_OTHER): Payer: Self-pay | Admitting: Surgery

## 2011-11-07 VITALS — BP 122/84 | HR 70 | Temp 98.1°F | Resp 18 | Ht 66.5 in | Wt 286.8 lb

## 2011-11-07 DIAGNOSIS — Z853 Personal history of malignant neoplasm of breast: Secondary | ICD-10-CM | POA: Diagnosis not present

## 2011-11-07 NOTE — Progress Notes (Signed)
[facility]  Ovidio Kin, MD,  FACS [address] [place]  [address] [place], [place]    [address] Phone:  [phone] FAX:  [phone]   Re:   [name] DOB:   June 23, 1941 MRN:   [id number]  ASSESSMENT AND PLAN: 1.  Stage 1 left breast cancer  Lumpectomy for 0.5 cm IDC, 0/8 nodes - October 1998  Disease free.  Will see back in one year.  2.  Abnormal calcifications.  Being followed by Garald Braver.  Had benign biopsy in 2012. 3.  History of hematuria - was followed by Dr. Aldean Ast. 4.  A. Fib. - followed by Dr. Antoine Poche. 5.  On Pradaxa as anticoagulation. 6.  History of calcified uterine fibroid. 7.  History of gall stones.  Asymptomatic. 8.  Trying to adjust her statins for her cholesterol.  HISTORY OF PRESENT ILLNESS: Chief Complaint  Patient presents with  . Breast Cancer Long Term Follow Up   [name] [name] is a 71 y.o. (DOB: 1941-09-13)  AA female who is a patient of Carolyne Fiscal, MD, MD (she was a pt of Dr. Lorenz Coaster - he retired Jan 2013) and comes to me today for follow up of left breast cancer.  She was found to have some calcifications in her left breast last year at [facility].  COre biopsy was benign.  She is on q 6 month mammograms.  She has felt no mass.  PHYSICAL EXAM: BP 122/84  Pulse 70  Temp(Src) 98.1 F (36.7 C) (Temporal)  Resp 18  Ht 5' 6.5" (1.689 m)  Wt 286 lb 12.8 oz (130.092 kg)  BMI 45.60 kg/m2  HEENT:  Pupils equal.  Dentition good.  No injury. NECK:  Supple.  No thyroid mass. LYMPH NODES:  No cervical, supraclavicular, or axillary adenopathy. BREASTS -  RIGHT:  No palpable mass or nodule.  No nipple discharge.   LEFT:  No palpable mass or nodule.  No nipple discharge.  Her scar at the 12 o'clock position is well healed.  There is no worrisome mass. UPPER EXTREMITIES:  No evidence of lymphedema.  DATA REVIEWED: [facility] mammogram - 07/03/2011 - six month f/u for calcs in left breast  Ovidio Kin, MD, FACS Office:   [phone]

## 2011-11-21 DIAGNOSIS — E785 Hyperlipidemia, unspecified: Secondary | ICD-10-CM | POA: Diagnosis not present

## 2012-01-01 DIAGNOSIS — Z79899 Other long term (current) drug therapy: Secondary | ICD-10-CM | POA: Diagnosis not present

## 2012-01-01 DIAGNOSIS — E782 Mixed hyperlipidemia: Secondary | ICD-10-CM | POA: Diagnosis not present

## 2012-01-07 DIAGNOSIS — Z09 Encounter for follow-up examination after completed treatment for conditions other than malignant neoplasm: Secondary | ICD-10-CM | POA: Diagnosis not present

## 2012-01-07 DIAGNOSIS — Z853 Personal history of malignant neoplasm of breast: Secondary | ICD-10-CM | POA: Diagnosis not present

## 2012-01-07 DIAGNOSIS — Z9010 Acquired absence of unspecified breast and nipple: Secondary | ICD-10-CM | POA: Diagnosis not present

## 2012-01-25 ENCOUNTER — Encounter (INDEPENDENT_AMBULATORY_CARE_PROVIDER_SITE_OTHER): Payer: Self-pay

## 2012-02-26 ENCOUNTER — Ambulatory Visit
Admission: RE | Admit: 2012-02-26 | Discharge: 2012-02-26 | Disposition: A | Discharge status: Home / Self Care | Payer: Medicare Other | Source: Ambulatory Visit | Attending: Family Medicine | Admitting: Family Medicine

## 2012-02-26 ENCOUNTER — Other Ambulatory Visit: Payer: Self-pay | Admitting: Family Medicine

## 2012-02-26 DIAGNOSIS — M1610 Unilateral primary osteoarthritis, unspecified hip: Secondary | ICD-10-CM | POA: Diagnosis not present

## 2012-02-26 DIAGNOSIS — M25559 Pain in unspecified hip: Secondary | ICD-10-CM

## 2012-02-26 DIAGNOSIS — R1031 Right lower quadrant pain: Secondary | ICD-10-CM | POA: Diagnosis not present

## 2012-03-24 ENCOUNTER — Other Ambulatory Visit: Payer: Self-pay | Admitting: Family Medicine

## 2012-03-24 DIAGNOSIS — Z23 Encounter for immunization: Secondary | ICD-10-CM | POA: Diagnosis not present

## 2012-03-24 DIAGNOSIS — Z0000 Encounter for general adult medical examination without abnormal findings: Secondary | ICD-10-CM | POA: Diagnosis not present

## 2012-03-24 DIAGNOSIS — M25551 Pain in right hip: Secondary | ICD-10-CM

## 2012-04-11 ENCOUNTER — Telehealth: Payer: Self-pay | Admitting: Cardiology

## 2012-04-11 NOTE — Telephone Encounter (Signed)
Left message for [name] [name], the pt has not been seen since 2011. Pt will probably need a appointment to get clearance. She is to call back with questions.

## 2012-04-11 NOTE — Telephone Encounter (Signed)
New problem:  Per Duwayne Heck calling from [facility], fax request on 7/12. Need to stop praxada. Due to upcoming procedure.

## 2012-04-14 NOTE — Telephone Encounter (Signed)
Pt has been scheduled for follow-up for cardiac clearance since she has not been seen in so long

## 2012-04-30 ENCOUNTER — Ambulatory Visit (INDEPENDENT_AMBULATORY_CARE_PROVIDER_SITE_OTHER): Payer: Medicare Other | Admitting: Nurse Practitioner

## 2012-04-30 ENCOUNTER — Encounter: Payer: Self-pay | Admitting: Nurse Practitioner

## 2012-04-30 VITALS — BP 138/86 | HR 63 | Ht 66.5 in | Wt 282.0 lb

## 2012-04-30 DIAGNOSIS — I4891 Unspecified atrial fibrillation: Secondary | ICD-10-CM

## 2012-04-30 DIAGNOSIS — E785 Hyperlipidemia, unspecified: Secondary | ICD-10-CM

## 2012-04-30 DIAGNOSIS — I10 Essential (primary) hypertension: Secondary | ICD-10-CM | POA: Diagnosis not present

## 2012-04-30 NOTE — Patient Instructions (Addendum)
Your physician recommends that you continue on your current medications as directed. Please refer to the Current Medication list given to you today.  Your physician recommends that you schedule a follow-up appointment in: 6 months with Dr. Hochrein  

## 2012-04-30 NOTE — Progress Notes (Signed)
Patient Name: [name] [name] Date of Encounter: 04/30/2012  Primary Care Provider:  Carolyne Fiscal, MD Primary Cardiologist:  Shela Commons. Hochrein, MD   Patient Profile  71 y/o female with h/o chronic afib on pradaxa who presents for f/u.  Problem List   Past Medical History  Diagnosis Date  . Atrial fibrillation     a. 11/2006 Echo: EF 50-55%, mild MR;  b. On Pradaxa & rate control  . Hypothyroidism   . Hypertension   . Dyslipidemia   . Asthma   . Breast cancer     with lymph node resection (16 nodes)  . Diverticulosis   . Hyperplastic colon polyp 2005  . Arthritis   . Gallstones   . Obesity   . Hemorrhoids   . Osteoarthritis     a. knees/hips   Past Surgical History  Procedure Date  . Lymph node dissection     due to breast cancer  . Mole excision   . Breast lumpectomy     left    Allergies  Allergies  Allergen Reactions  . Cortisone     REACTION: weight gain  . Statins     Myalgias     HPI  71 y/o female with the above problem list.  She was last seen in clinic in October of 2011.  She has been maintained on pradaxa chronically and has had no issues with bleeding.  She remains in atrial fibrillation, which is rate-controlled and asymptomatic.  She has been increasing her activity @ home and also cutting calories and has lost some weight.  She feels that she has much more energy in the setting of weight loss.  She's walking 2-3 x/wk for 15-30 mins @ a time without chest pain or significant DOE.  She denies pnd, orthopnea, n, v, dizziness, syncope, edema, weight gain, or early satiety.  Activity has been somewhat limited by right hip->knee pain.  She does have arthritis and is tentatively scheduled to have a steroid injection in her right hip.  Home Medications  Prior to Admission medications   Medication Sig Start Date End Date Taking? Authorizing Provider  albuterol (PROVENTIL HFA;VENTOLIN HFA) 108 (90 BASE) MCG/ACT inhaler Inhale 2 puffs into the lungs. As  needed    Yes Historical Provider, MD  beclomethasone (BECONASE-AQ) 42 MCG/SPRAY nasal spray 2 sprays by Nasal route. Dose is for each nostril as needed    Yes Historical Provider, MD  Blood Glucose Monitoring Suppl (FREESTYLE FREEDOM) KIT As directed    Yes Historical Provider, MD  dabigatran (PRADAXA) 150 MG CAPS Take 150 mg by mouth. Take one capsule by mouth twice a day    Yes Historical Provider, MD  Multiple Vitamins-Minerals (WOMENS MULTI VITAMIN & MINERAL PO) Take by mouth as needed.    Yes Historical Provider, MD  pindolol (VISKEN) 5 MG tablet 1/2 BY MOUTH DAILY 08/31/11  Yes Rollene Rotunda, MD    Review of Systems  She has lower extremity arthritic pain as outlined above.  No chest pain, sob, n, v, dizziness, syncope, edema, early satiety, dysuria, dark stools, blood in stools, diarrhea, rash/skin changes, fevers, chills, wt loss/gain.  Otherwise all systems reviewed and negative.  Physical Exam  Blood pressure 138/86, pulse 63, height 5' 6.5" (1.689 m), weight 282 lb (127.914 kg), SpO2 98.00%.  General: Pleasant, obese, NAD Psych: Normal affect. Neuro: Alert and oriented X 3. Moves all extremities spontaneously. HEENT: Normal  Neck: Supple, obese, without bruits.  Difficult to assess JVD. Lungs:  Resp  regular and unlabored, CTA. Heart: IR, IR no s3, s4, or murmurs. Abdomen: Soft, non-tender, non-distended, BS + x 4.  Extremities: No clubbing, cyanosis or edema. DP/PT/Radials 2+ and equal bilaterally.  Accessory Clinical Findings  ECG - From PCP office 03/17/2012 AFib, 120, baseline artifact, no acute st/t changes.  Assessment & Plan  1.  Atrial fibrillation:  Currently rate controlled.  Rate was up 7/1 when she had ECG with PCP however is only 63 today.  She remains on pindolol 2.5 mg daily and her pcp has switched her to toprol xl (for improved compliance), to be started when she has run out of this bottle.  She remains on pradaxa and is tolerating this well.  She is pending  steroid injection for right hip pain.  Should anticoagulation need to be discontinued prior to injection, pradaxa can be held beginning 2 days prior to injection and then resumed once it is felt to be safe by ortho.  She does not require bridging.    2.  HTN:  Stable.  3.  HL:  Recent LDL 124 in July.  She has prior intolerance to multiple statins and has recently been Rx lipitor 5 qod - she has not yet picked up this Rx.  4.  DM:  Diet controlled.  She has been losing weight.  A1C 6.1 03/2012.  5.  OA:  Pending injection of R Hip.  See discussion above re: anticoagulation.  6.  Dispo:  F/u Dr. Antoine Poche in 6 months or sooner if necessary.    Nicolasa Ducking, NP 04/30/2012, 12:02 PM

## 2012-05-22 DIAGNOSIS — E782 Mixed hyperlipidemia: Secondary | ICD-10-CM | POA: Diagnosis not present

## 2012-05-26 ENCOUNTER — Ambulatory Visit
Admission: RE | Admit: 2012-05-26 | Discharge: 2012-05-26 | Disposition: A | Discharge status: Home / Self Care | Payer: Medicare Other | Source: Ambulatory Visit | Attending: Family Medicine | Admitting: Family Medicine

## 2012-05-26 DIAGNOSIS — M169 Osteoarthritis of hip, unspecified: Secondary | ICD-10-CM | POA: Diagnosis not present

## 2012-05-26 DIAGNOSIS — M25551 Pain in right hip: Secondary | ICD-10-CM

## 2012-05-26 MED ORDER — IOHEXOL 180 MG/ML  SOLN
1.0000 mL | Freq: Once | INTRAMUSCULAR | Status: AC | PRN
  Administered 2012-05-26: 1 mL via INTRA_ARTICULAR

## 2012-05-26 MED ORDER — METHYLPREDNISOLONE ACETATE 40 MG/ML INJ SUSP (RADIOLOG
120.0000 mg | Freq: Once | INTRAMUSCULAR | Status: AC
  Administered 2012-05-26: 120 mg via INTRA_ARTICULAR

## 2012-05-27 DIAGNOSIS — J069 Acute upper respiratory infection, unspecified: Secondary | ICD-10-CM | POA: Diagnosis not present

## 2012-05-27 DIAGNOSIS — I10 Essential (primary) hypertension: Secondary | ICD-10-CM | POA: Diagnosis not present

## 2012-05-27 DIAGNOSIS — E782 Mixed hyperlipidemia: Secondary | ICD-10-CM | POA: Diagnosis not present

## 2012-07-03 DIAGNOSIS — R920 Mammographic microcalcification found on diagnostic imaging of breast: Secondary | ICD-10-CM | POA: Diagnosis not present

## 2012-07-03 DIAGNOSIS — Z853 Personal history of malignant neoplasm of breast: Secondary | ICD-10-CM | POA: Diagnosis not present

## 2012-07-08 DIAGNOSIS — Z23 Encounter for immunization: Secondary | ICD-10-CM | POA: Diagnosis not present

## 2012-08-21 DIAGNOSIS — E782 Mixed hyperlipidemia: Secondary | ICD-10-CM | POA: Diagnosis not present

## 2012-08-25 DIAGNOSIS — I10 Essential (primary) hypertension: Secondary | ICD-10-CM | POA: Diagnosis not present

## 2012-08-25 DIAGNOSIS — E782 Mixed hyperlipidemia: Secondary | ICD-10-CM | POA: Diagnosis not present

## 2012-08-25 DIAGNOSIS — E119 Type 2 diabetes mellitus without complications: Secondary | ICD-10-CM | POA: Diagnosis not present

## 2012-09-05 DIAGNOSIS — I10 Essential (primary) hypertension: Secondary | ICD-10-CM | POA: Diagnosis not present

## 2012-10-20 DIAGNOSIS — H2510 Age-related nuclear cataract, unspecified eye: Secondary | ICD-10-CM | POA: Diagnosis not present

## 2012-10-24 ENCOUNTER — Encounter: Payer: Self-pay | Admitting: Cardiology

## 2012-10-24 ENCOUNTER — Ambulatory Visit (INDEPENDENT_AMBULATORY_CARE_PROVIDER_SITE_OTHER): Payer: Medicare Other | Admitting: Cardiology

## 2012-10-24 VITALS — BP 142/76 | HR 111 | Ht 67.0 in | Wt 269.8 lb

## 2012-10-24 DIAGNOSIS — E669 Obesity, unspecified: Secondary | ICD-10-CM | POA: Diagnosis not present

## 2012-10-24 DIAGNOSIS — I4891 Unspecified atrial fibrillation: Secondary | ICD-10-CM | POA: Diagnosis not present

## 2012-10-24 DIAGNOSIS — I10 Essential (primary) hypertension: Secondary | ICD-10-CM

## 2012-10-24 MED ORDER — METOPROLOL SUCCINATE ER 25 MG PO TB24: 25.0000 mg | ORAL_TABLET | Freq: Every day | ORAL | Status: DC

## 2012-10-24 NOTE — Patient Instructions (Addendum)
Please take Toprol 25 mg  one a day. Continue all other medications as listed.  Follow up in 1 year with Dr Antoine Poche.  You will receive a letter in the mail 2 months before you are due.  Please call us when you receive this letter to schedule your follow up appointment.

## 2012-10-24 NOTE — Progress Notes (Signed)
HPI The patient presents for followup of atrial fibrillation. Since I last saw her she got married. She has lost weight with diet. She's feeling well. The patient denies any new symptoms such as chest discomfort, neck or arm discomfort. There has been no new shortness of breath, PND or orthopnea. There have been no reported palpitations, presyncope or syncope.     Allergies  Allergen Reactions  . Cortisone     REACTION: weight gain  . Statins     Myalgias     Current Outpatient Prescriptions  Medication Sig Dispense Refill  . albuterol (PROVENTIL HFA;VENTOLIN HFA) 108 (90 BASE) MCG/ACT inhaler Inhale 2 puffs into the lungs every 6 (six) hours as needed.      . beclomethasone (BECONASE-AQ) 42 MCG/SPRAY nasal spray 2 sprays by Nasal route. Dose is for each nostril as needed       . Blood Glucose Monitoring Suppl (FREESTYLE FREEDOM) KIT As directed       . colchicine 0.6 MG tablet Take 0.6 mg by mouth 2 (two) times daily. Takes 2 at onset of gout followed by 1 tablet 1 hour later      . dabigatran (PRADAXA) 150 MG CAPS Take 150 mg by mouth. Take one capsule by mouth twice a day       . metoprolol succinate (TOPROL-XL) 25 MG 24 hr tablet Take 25 mg by mouth daily. Takes 1/2 daily        Past Medical History  Diagnosis Date  . Atrial fibrillation     a. 11/2006 Echo: EF 50-55%, mild MR;  b. On Pradaxa & rate control  . Hypothyroidism   . Hypertension   . Dyslipidemia   . Asthma   . Breast cancer     with lymph node resection (16 nodes)  . Diverticulosis   . Hyperplastic colon polyp 2005  . Arthritis   . Gallstones   . Obesity   . Hemorrhoids   . Osteoarthritis     a. knees/hips    Past Surgical History  Procedure Date  . Lymph node dissection     due to breast cancer  . Mole excision   . Breast lumpectomy     left   ROS:  As stated in the HPI and negative for all other systems.  PHYSICAL EXAM BP 142/76  Pulse 111  Ht 5\' 7"  (1.702 m)  Wt 269 lb 12.8 oz (122.38  kg)  BMI 42.26 kg/m2 GENERAL:  Well appearing NECK:  No jugular venous distention, waveform within normal limits, carotid upstroke brisk and symmetric, no bruits, no thyromegaly LUNGS:  Clear to auscultation bilaterally CHEST:  Unremarkable HEART:  PMI not displaced or sustained,S1 and S2 within normal limits, no S3,no clicks, no rubs, no murmurs, irregular ABD:  Flat, positive bowel sounds normal in frequency in pitch, no bruits, no rebound, no guarding, no midline pulsatile mass, no hepatomegaly, no splenomegaly EXT:  2 plus pulses throughout, no edema, no cyanosis no clubbing  EKG:  Atrial fibrillation, rate 111, axis within normal limits, intervals within normal limits, no acute ST-T wave changes. 10/24/2012   ASSESSMENT AND PLAN  Atrial fibrillation:  The rate is a little bit fast today in the office. She's been switched from Pindolol to Toprol for compliance. She reduced her dose in half on her own and I have encouraged her to start back on the wall 25 mg.  HTN:  Her blood pressure will be controlled in the context of managing her heart rate and  atrial fibrillation.  HL: She has recently been tried on multiple statins. She's intolerant of them all. I will defer to Carolyne Fiscal, MD

## 2012-11-14 ENCOUNTER — Ambulatory Visit (INDEPENDENT_AMBULATORY_CARE_PROVIDER_SITE_OTHER): Payer: Medicare Other | Admitting: Surgery

## 2012-11-14 ENCOUNTER — Encounter (INDEPENDENT_AMBULATORY_CARE_PROVIDER_SITE_OTHER): Payer: Self-pay | Admitting: Surgery

## 2012-11-14 VITALS — BP 122/74 | HR 82 | Temp 97.5°F | Resp 18 | Ht 67.0 in | Wt 272.2 lb

## 2012-11-14 DIAGNOSIS — Z853 Personal history of malignant neoplasm of breast: Secondary | ICD-10-CM

## 2012-11-14 NOTE — Progress Notes (Signed)
[facility]  Ovidio Kin, MD,  FACS [address]  [address] [place], [place]    [address] Phone:  [phone] FAX:  [phone]   Re:   BRIEANNE MIGNONE DOB:   1940/12/16 MRN:   [id number]  ASSESSMENT AND PLAN: 1.  Stage 1 left breast cancer  Lumpectomy for 0.5 cm IDC, 0/8 nodes - October 1998  Disease free.  Will see back in one year.  2.  Abnormal calcifications.  Had benign biopsy in 2012. 3.  History of hematuria - was followed by Dr. Aldean Ast. 4.  A. Fib. - followed by Dr. Antoine Poche. 5.  On Pradaxa as anticoagulation. 6.  History of calcified uterine fibroid. 7.  History of gall stones.    Asymptomatic. 8.  Trying to adjust her statins for her cholesterol. 9.  Has had some success in loosing weight. 10.  Got married in Nov. 2013  HISTORY OF PRESENT ILLNESS: Chief Complaint  Patient presents with  . Follow-up    LTF yrly br reck   [name] [name] is a 72 y.o. (DOB: 24-Aug-1941)  AA female who is a patient of Mosetta Putt F, MD (she was a pt of Dr. Lorenz Coaster - he retired Jan 2013) and comes to me today for follow up of left breast cancer.  She is back on once a year mammograms.  She got married in Nov 2013 and had he picture book with her.  She married a handsome man.  She is on weight watchers and has lost about 10 pounds since I last saw her.  She is in good spirits and has no concerns.   Social History: She got married for the first time last year    PHYSICAL EXAM: BP 122/74  Pulse 82  Temp(Src) 97.5 F (36.4 C) (Temporal)  Resp 18  Ht 5\' 7"  (1.702 m)  Wt 272 lb 3.2 oz (123.469 kg)  BMI 42.62 kg/m2  HEENT:  Pupils equal.  Dentition good.  No injury. NECK:  Supple.  No thyroid mass. LYMPH NODES:  No cervical, supraclavicular, or axillary adenopathy. BREASTS -  RIGHT:  No palpable mass or nodule.  No nipple discharge.   LEFT:  No palpable mass or nodule.  No nipple discharge.  Her scar at the 12 o'clock position is  well healed.  There is no worrisome mass. UPPER EXTREMITIES:  No evidence of lymphedema.  DATA REVIEWED: [facility]ogram - 07/03/2012 - okay, one year follow up  Ovidio Kin, MD, FACS Office:  [phone]

## 2012-11-20 ENCOUNTER — Encounter (HOSPITAL_COMMUNITY): Payer: Self-pay | Admitting: *Deleted

## 2012-11-20 ENCOUNTER — Emergency Department (INDEPENDENT_AMBULATORY_CARE_PROVIDER_SITE_OTHER)
Admission: EM | Admit: 2012-11-20 | Discharge: 2012-11-20 | Disposition: A | Discharge status: Home / Self Care | Payer: Medicare Other | Source: Home / Self Care | Attending: Emergency Medicine | Admitting: Emergency Medicine

## 2012-11-20 DIAGNOSIS — J209 Acute bronchitis, unspecified: Secondary | ICD-10-CM

## 2012-11-20 DIAGNOSIS — J019 Acute sinusitis, unspecified: Secondary | ICD-10-CM

## 2012-11-20 MED ORDER — AMOXICILLIN-POT CLAVULANATE 875-125 MG PO TABS: 1.0000 | ORAL_TABLET | Freq: Two times a day (BID) | ORAL | Status: DC

## 2012-11-20 NOTE — ED Notes (Signed)
Pt  Reports  Symptoms  Of  Cough  /  Congested   And   Some  Wheezing  r  Side      Symptoms  For  About 1  Week          Pt  Reports  Has  Been  Using  Cough  Med   That she  Had   On  Hand  For  The  Symptoms  But they  Persist       She  Is  Alert  And  Oriented  Speaking in  Complete  sentances   And  No  Severe  Distress

## 2012-11-20 NOTE — ED Provider Notes (Signed)
Chief Complaint  Patient presents with  . Cough    History of Present Illness:   [name] [name]  is a 72 year old female who's had a one-week history of nasal congestion with yellow-green drainage, headache, cough productive yellow-green sputum, wheezing, sweats, slight sore throat, and hoarseness. She denies any fever. She has had some diarrhea but no abdominal pain, nausea, or vomiting.  Review of Systems:  Other than noted above, the patient denies any of the following symptoms. Systemic:  No fever, chills, sweats, fatigue, myalgias, headache, or anorexia. Eye:  No redness, pain or drainage. ENT:  No earache, ear congestion, nasal congestion, sneezing, rhinorrhea, sinus pressure, sinus pain, post nasal drip, or sore throat. Lungs:  No cough, sputum production, wheezing, shortness of breath, or chest pain. GI:  No abdominal pain, nausea, vomiting, or diarrhea.  PMFSH:  Past medical history, family history, social history, meds, and allergies were reviewed.  Physical Exam:   Vital signs:  BP 153/85  Pulse 100  Temp(Src) 97.8 F (36.6 C) (Oral)  Resp 16  SpO2 100% General:  Alert, in no distress. Eye:  No conjunctival injection or drainage. Lids were normal. ENT:  TMs and canals were normal, without erythema or inflammation.  Nasal mucosa was clear and uncongested, without drainage.  Mucous membranes were moist.  Pharynx was clear, without exudate or drainage.  There were no oral ulcerations or lesions. Neck:  Supple, no adenopathy, tenderness or mass. Lungs:  No respiratory distress.  Lungs were clear to auscultation, without wheezes, rales or rhonchi.  Breath sounds were clear and equal bilaterally.  Heart:  Regular rhythm, without gallops, murmers or rubs. Skin:  Clear, warm, and dry, without rash or lesions.  Assessment:  The primary encounter diagnosis was Acute sinusitis. A diagnosis of Acute bronchitis was also pertinent to this visit.  Plan:   1.  The following meds were  prescribed:   Discharge Medication List as of 11/20/2012  2:41 PM    START taking these medications   Details  amoxicillin-clavulanate (AUGMENTIN) 875-125 MG per tablet Take 1 tablet by mouth 2 (two) times daily., Starting 11/20/2012, Until Discontinued, Normal       2.  The patient was instructed in symptomatic care and handouts were given. 3.  The patient was told to return if becoming worse in any way, if no better in 3 or 4 days, and given some red flag symptoms that would indicate earlier return.   Reuben Likes, MD 11/20/12 [phone]

## 2012-12-18 DIAGNOSIS — I10 Essential (primary) hypertension: Secondary | ICD-10-CM | POA: Diagnosis not present

## 2013-02-02 DIAGNOSIS — R05 Cough: Secondary | ICD-10-CM | POA: Diagnosis not present

## 2013-02-02 DIAGNOSIS — R21 Rash and other nonspecific skin eruption: Secondary | ICD-10-CM | POA: Diagnosis not present

## 2013-03-24 DIAGNOSIS — Z0000 Encounter for general adult medical examination without abnormal findings: Secondary | ICD-10-CM | POA: Diagnosis not present

## 2013-03-31 DIAGNOSIS — Z0000 Encounter for general adult medical examination without abnormal findings: Secondary | ICD-10-CM | POA: Diagnosis not present

## 2013-04-01 ENCOUNTER — Other Ambulatory Visit (INDEPENDENT_AMBULATORY_CARE_PROVIDER_SITE_OTHER): Payer: Medicare Other | Admitting: *Deleted

## 2013-04-01 ENCOUNTER — Encounter: Payer: Self-pay | Admitting: Family Medicine

## 2013-04-01 DIAGNOSIS — R0989 Other specified symptoms and signs involving the circulatory and respiratory systems: Secondary | ICD-10-CM | POA: Diagnosis not present

## 2013-04-09 DIAGNOSIS — Z780 Asymptomatic menopausal state: Secondary | ICD-10-CM | POA: Diagnosis not present

## 2013-04-21 ENCOUNTER — Encounter (INDEPENDENT_AMBULATORY_CARE_PROVIDER_SITE_OTHER): Payer: Self-pay

## 2013-04-28 ENCOUNTER — Emergency Department (INDEPENDENT_AMBULATORY_CARE_PROVIDER_SITE_OTHER)
Admission: EM | Admit: 2013-04-28 | Discharge: 2013-04-28 | Disposition: A | Discharge status: Home / Self Care | Payer: Medicare Other | Source: Home / Self Care | Admitting: Family Medicine

## 2013-04-28 ENCOUNTER — Emergency Department (INDEPENDENT_AMBULATORY_CARE_PROVIDER_SITE_OTHER): Payer: Medicare Other

## 2013-04-28 ENCOUNTER — Encounter (HOSPITAL_COMMUNITY): Payer: Self-pay | Admitting: Emergency Medicine

## 2013-04-28 DIAGNOSIS — S99929A Unspecified injury of unspecified foot, initial encounter: Secondary | ICD-10-CM | POA: Diagnosis not present

## 2013-04-28 DIAGNOSIS — M25569 Pain in unspecified knee: Secondary | ICD-10-CM

## 2013-04-28 DIAGNOSIS — M1710 Unilateral primary osteoarthritis, unspecified knee: Secondary | ICD-10-CM

## 2013-04-28 DIAGNOSIS — S8990XA Unspecified injury of unspecified lower leg, initial encounter: Secondary | ICD-10-CM | POA: Diagnosis not present

## 2013-04-28 DIAGNOSIS — M25561 Pain in right knee: Secondary | ICD-10-CM

## 2013-04-28 DIAGNOSIS — M1711 Unilateral primary osteoarthritis, right knee: Secondary | ICD-10-CM

## 2013-04-28 NOTE — ED Notes (Signed)
Patient reports a fall this afternoon.  Slipped on a wet floor at home, landing on right leg, reports leg under her in fall.  Pain in right leg, able to walk

## 2013-04-28 NOTE — ED Provider Notes (Signed)
CSN: [id number]     Arrival date & time 04/28/13  1517 History     None    Chief Complaint  Patient presents with  . Fall   (Consider location/radiation/quality/duration/timing/severity/associated sxs/prior Treatment) HPI 72 yo bf comes in with complaints of right knee pain.  States that earlier today she slipped on her wet kitchen floor and her knee folded underneath her.  Had pain immediately after but has always had some issues due to DJD.  Pain with ambulation.  Some swelling.  Some feeling of mechanical symptoms.  No other injuries.    Past Medical History  Diagnosis Date  . Atrial fibrillation     a. 11/2006 Echo: EF 50-55%, mild MR;  b. On Pradaxa & rate control  . Hypothyroidism   . Hypertension   . Dyslipidemia   . Asthma   . Breast cancer     with lymph node resection (16 nodes)  . Diverticulosis   . Hyperplastic colon polyp 2005  . Arthritis   . Gallstones   . Obesity   . Hemorrhoids   . Osteoarthritis     a. knees/hips   Past Surgical History  Procedure Laterality Date  . Lymph node dissection      due to breast cancer  . Mole excision    . Breast lumpectomy      left   Family History  Problem Relation Age of Onset  . Heart disease Father   . Hypertension Father   . Alzheimer's disease Mother   . Breast cancer Mother   . Stroke Brother   . Hypertension Brother   . Prostate cancer Maternal Grandfather   . Colon cancer Maternal Aunt    History  Substance Use Topics  . Smoking status: Never Smoker   . Smokeless tobacco: Never Used  . Alcohol Use: No   OB History   Grav Para Term Preterm Abortions TAB SAB Ect Mult Living                 Review of Systems  Constitutional: Negative.   HENT: Negative.   Respiratory: Negative.   Cardiovascular: Negative.   Musculoskeletal: Positive for joint swelling.       Knee pain  Skin: Negative.   Neurological: Negative.   Psychiatric/Behavioral: Negative.     Allergies  Cortisone and Statins  Home  Medications   Current Outpatient Rx  Name  Route  Sig  Dispense  Refill  . albuterol (PROVENTIL HFA;VENTOLIN HFA) 108 (90 BASE) MCG/ACT inhaler   Inhalation   Inhale 2 puffs into the lungs every 6 (six) hours as needed.         Marland Kitchen amoxicillin-clavulanate (AUGMENTIN) 875-125 MG per tablet   Oral   Take 1 tablet by mouth 2 (two) times daily.   20 tablet   0   . atorvastatin (LIPITOR) 10 MG tablet      1/2 tablet every other day. Patient states they are trying this Statin slowly, r/t previous attempts affects her legs.         . beclomethasone (BECONASE-AQ) 42 MCG/SPRAY nasal spray   Nasal   2 sprays by Nasal route. Dose is for each nostril as needed          . Blood Glucose Monitoring Suppl (FREESTYLE FREEDOM) KIT      As directed          . chlorpheniramine-HYDROcodone (TUSSIONEX) 10-8 MG/5ML LQCR               .  cholecalciferol (VITAMIN D) 400 UNITS TABS   Oral   Take 1,000 Units by mouth every other day.         . colchicine 0.6 MG tablet   Oral   Take 0.6 mg by mouth 2 (two) times daily. Takes 2 at onset of gout followed by 1 tablet 1 hour later         . dabigatran (PRADAXA) 150 MG CAPS   Oral   Take 150 mg by mouth. Take one capsule by mouth twice a day          . metoprolol succinate (TOPROL-XL) 25 MG 24 hr tablet   Oral   Take 1 tablet (25 mg total) by mouth daily.   30 tablet   6   . Multiple Vitamins-Minerals (CENTRUM SILVER ADULT 50+ PO)   Oral   Take by mouth.          BP 134/94  Pulse 64  Temp(Src) 98.7 F (37.1 C) (Oral)  Resp 16  SpO2 96% Physical Exam  Constitutional: She is oriented to person, place, and time. She appears well-developed and well-nourished.  Pulmonary/Chest: Effort normal and breath sounds normal.  Musculoskeletal: Normal range of motion. She exhibits tenderness.  Crepitus.  Neurological: She is alert and oriented to person, place, and time.  Skin: Skin is warm and dry.  Psychiatric: She has a normal  mood and affect.    ED Course   Procedures (including critical care time)  Labs Reviewed - No data to display Dg Knee Complete 4 Views Right  04/28/2013   *RADIOLOGY REPORT*  Clinical Data: Fall today with right knee pain  RIGHT KNEE - COMPLETE 4+ VIEW  Comparison: None.  Findings: No fracture or dislocation.  There are degenerative changes with joint space narrowing and marginal osteophytes most prominent of the lateral compartment. The bones are demineralized.  A small to moderate joint effusion is present.  The surrounding soft tissues are unremarkable.  IMPRESSION: No fracture.  Small to moderate joint effusion.  Advanced degenerative changes.   Original Report Authenticated By: Amie Portland, M.D.   1. Knee pain, right   2. Right knee DJD     MDM  Patient will f/u with Dr Salvatore Marvel ortho to discuss further treatment options for her knee.   Zonia Kief, PA-C 04/28/13 [phone]

## 2013-06-24 NOTE — ED Provider Notes (Signed)
Medical screening examination/treatment/procedure(s) were performed by a resident physician or non-physician practitioner and as the supervising physician I was immediately available for consultation/collaboration.  Clementeen Graham, MD    Rodolph Bong, MD 06/24/13 [phone]

## 2013-07-06 DIAGNOSIS — R928 Other abnormal and inconclusive findings on diagnostic imaging of breast: Secondary | ICD-10-CM | POA: Diagnosis not present

## 2013-07-06 DIAGNOSIS — Z23 Encounter for immunization: Secondary | ICD-10-CM | POA: Diagnosis not present

## 2013-07-15 ENCOUNTER — Encounter (INDEPENDENT_AMBULATORY_CARE_PROVIDER_SITE_OTHER): Payer: Self-pay

## 2013-08-03 ENCOUNTER — Encounter (INDEPENDENT_AMBULATORY_CARE_PROVIDER_SITE_OTHER): Payer: Self-pay

## 2013-09-23 ENCOUNTER — Encounter: Payer: Self-pay | Admitting: Internal Medicine

## 2013-09-24 DIAGNOSIS — M1990 Unspecified osteoarthritis, unspecified site: Secondary | ICD-10-CM | POA: Diagnosis not present

## 2013-09-24 DIAGNOSIS — I4891 Unspecified atrial fibrillation: Secondary | ICD-10-CM | POA: Diagnosis not present

## 2013-10-26 ENCOUNTER — Ambulatory Visit (INDEPENDENT_AMBULATORY_CARE_PROVIDER_SITE_OTHER): Payer: Medicare Other | Admitting: Cardiology

## 2013-10-26 ENCOUNTER — Encounter: Payer: Self-pay | Admitting: Cardiology

## 2013-10-26 VITALS — BP 150/82 | HR 111 | Ht 67.0 in | Wt 275.0 lb

## 2013-10-26 DIAGNOSIS — I4891 Unspecified atrial fibrillation: Secondary | ICD-10-CM | POA: Diagnosis not present

## 2013-10-26 DIAGNOSIS — E039 Hypothyroidism, unspecified: Secondary | ICD-10-CM | POA: Diagnosis not present

## 2013-10-26 LAB — TSH: TSH: 0.81 u[IU]/mL (ref 0.35–5.50)

## 2013-10-26 NOTE — Progress Notes (Signed)
HPI The patient presents for followup of atrial fibrillation. She's feeling well. The patient denies any new symptoms such as chest discomfort, neck or arm discomfort. There has been no new shortness of breath, PND or orthopnea. There have been no reported palpitations, presyncope or syncope.    She exercises routinely.  She follows a Weight Watchers diet.  She does not feel her heart rate.     Allergies  Allergen Reactions  . Cortisone     REACTION: weight gain  . Statins     Myalgias     Current Outpatient Prescriptions  Medication Sig Dispense Refill  . albuterol (PROVENTIL HFA;VENTOLIN HFA) 108 (90 BASE) MCG/ACT inhaler Inhale 2 puffs into the lungs every 6 (six) hours as needed.      . beclomethasone (BECONASE-AQ) 42 MCG/SPRAY nasal spray 2 sprays by Nasal route. Dose is for each nostril as needed       . Blood Glucose Monitoring Suppl (FREESTYLE FREEDOM) KIT As directed       . chlorpheniramine-HYDROcodone (TUSSIONEX) 10-8 MG/5ML LQCR       . dabigatran (PRADAXA) 150 MG CAPS Take 150 mg by mouth. Take one capsule by mouth twice a day       . metoprolol succinate (TOPROL-XL) 25 MG 24 hr tablet Take 1 tablet (25 mg total) by mouth daily.  30 tablet  6  . Multiple Vitamins-Minerals (CENTRUM SILVER ADULT 50+ PO) Take by mouth.       No current facility-administered medications for this visit.    Past Medical History  Diagnosis Date  . Atrial fibrillation     a. 11/2006 Echo: EF 50-55%, mild MR;  b. On Pradaxa & rate control  . Hypothyroidism   . Hypertension   . Dyslipidemia   . Asthma   . Breast cancer     with lymph node resection (16 nodes)  . Diverticulosis   . Hyperplastic colon polyp 2005  . Arthritis   . Gallstones   . Obesity   . Hemorrhoids   . Osteoarthritis     a. knees/hips    Past Surgical History  Procedure Laterality Date  . Lymph node dissection      due to breast cancer  . Mole excision    . Breast lumpectomy      left   ROS:  As stated in  the HPI and negative for all other systems.  PHYSICAL EXAM BP 150/82  Pulse 111  Ht _0  (1.702 m)  Wt 275 lb (124.739 kg)  BMI 43.06 kg/m2 GENERAL:  Well appearing NECK:  No jugular venous distention, waveform within normal limits, carotid upstroke brisk and symmetric, no bruits, no thyromegaly LUNGS:  Clear to auscultation bilaterally CHEST:  Unremarkable HEART:  PMI not displaced or sustained,S1 and S2 within normal limits, no M3,NT clicks, no rubs, no murmurs, irregular ABD:  Flat, positive bowel sounds normal in frequency in pitch, no bruits, no rebound, no guarding, no midline pulsatile mass, no hepatomegaly, no splenomegaly EXT:  2 plus pulses throughout, no edema, no cyanosis no clubbing  EKG:  Atrial fibrillation, rate 111, axis within normal limits, intervals within normal limits, no acute ST-T wave changes. 10/26/2013   ASSESSMENT AND PLAN  Atrial fibrillation:  With a long walk down the office corridor today her heart rate was about 110. This would be reasonable control for this level of exertion. She tolerates anticoagulation and doesn't feel her heart rate. No change in therapy is indicated.Marland Kitchen  HTN:  Her blood  pressure at home is systolic 945.  I will not change her therapy.   HL: She has recently been tried on multiple statins. She's intolerant of them all.  No change in therapy is indicated.  SWEATING: She was hoping that I could check her thyroid as this is a prominent complaint.  I will check a TSH.   OBESITY:   She complies with diet and does walking.  Her weights are stable and down from her peak over a few years ago.  She will continue with current therapy.

## 2013-10-26 NOTE — Patient Instructions (Signed)
The current medical regimen is effective;  continue present plan and medications.  Please have blood work today (TSH).  Follow up in 1 year with Dr Percival Spanish.  You will receive a letter in the mail 2 months before you are due.  Please call us when you receive this letter to schedule your follow up appointment.

## 2013-12-15 ENCOUNTER — Encounter (HOSPITAL_COMMUNITY): Payer: Self-pay | Admitting: Emergency Medicine

## 2013-12-15 ENCOUNTER — Emergency Department (INDEPENDENT_AMBULATORY_CARE_PROVIDER_SITE_OTHER)
Admission: EM | Admit: 2013-12-15 | Discharge: 2013-12-15 | Disposition: A | Discharge status: Home / Self Care | Payer: Medicare Other | Source: Home / Self Care | Attending: Family Medicine | Admitting: Family Medicine

## 2013-12-15 DIAGNOSIS — H8110 Benign paroxysmal vertigo, unspecified ear: Secondary | ICD-10-CM | POA: Diagnosis not present

## 2013-12-15 LAB — POCT I-STAT, CHEM 8
BUN: 16 mg/dL (ref 6–23)
CHLORIDE: 103 meq/L (ref 96–112)
CREATININE: 1 mg/dL (ref 0.50–1.10)
Calcium, Ion: 1.17 mmol/L (ref 1.13–1.30)
Glucose, Bld: 118 mg/dL — ABNORMAL HIGH (ref 70–99)
HCT: 46 % (ref 36.0–46.0)
HEMOGLOBIN: 15.6 g/dL — AB (ref 12.0–15.0)
POTASSIUM: 3.3 meq/L — AB (ref 3.7–5.3)
SODIUM: 146 meq/L (ref 137–147)
TCO2: 30 mmol/L (ref 0–100)

## 2013-12-15 LAB — POCT URINALYSIS DIP (DEVICE)
BILIRUBIN URINE: NEGATIVE
Glucose, UA: NEGATIVE mg/dL
KETONES UR: NEGATIVE mg/dL
LEUKOCYTES UA: NEGATIVE
Nitrite: NEGATIVE
PH: 7 (ref 5.0–8.0)
Protein, ur: NEGATIVE mg/dL
SPECIFIC GRAVITY, URINE: 1.015 (ref 1.005–1.030)
Urobilinogen, UA: 0.2 mg/dL (ref 0.0–1.0)

## 2013-12-15 MED ORDER — MECLIZINE HCL 25 MG PO TABS: 25.0000 mg | ORAL_TABLET | Freq: Three times a day (TID) | ORAL | Status: DC | PRN

## 2013-12-15 NOTE — Discharge Instructions (Signed)

## 2013-12-15 NOTE — ED Notes (Signed)
Pt reports waking up this am around 0630 feeling dizzy and sweating Sxs also include: nauseas Hx of Afib, HTN BP today is 172/106 Denies CP, HA, SOB, weakness Alert w/no signs of acute distress.

## 2013-12-15 NOTE — ED Provider Notes (Signed)
CSN: [id number]     Arrival date & time 12/15/13  0802 History   None    Chief Complaint  Patient presents with  . Dizziness   (Consider location/radiation/quality/duration/timing/severity/associated sxs/prior Treatment) Patient is a 73 y.o. female presenting with dizziness. The history is provided by the patient.  Dizziness Quality:  Room spinning Severity:  Moderate Onset quality:  Gradual Duration:  1 day Timing:  Intermittent Chronicity:  New Context: head movement   Context: not when bending over, not with bowel movement, not with ear pain, not with eye movement, not with inactivity, not with loss of consciousness, not with medication, not with physical activity, not when standing up and not when urinating   Associated symptoms: no blood in stool, no chest pain, no diarrhea, no headaches, no hearing loss, no nausea, no palpitations, no shortness of breath, no syncope, no tinnitus, no vision changes, no vomiting and no weakness   Risk factors: no anemia, no hx of stroke, no hx of vertigo, no Meniere's disease and no new medications     Past Medical History  Diagnosis Date  . Atrial fibrillation     a. 11/2006 Echo: EF 50-55%, mild MR;  b. On Pradaxa & rate control  . Hypothyroidism   . Hypertension   . Dyslipidemia   . Asthma   . Breast cancer     with lymph node resection (16 nodes)  . Diverticulosis   . Hyperplastic colon polyp 2005  . Arthritis   . Gallstones   . Obesity   . Hemorrhoids   . Osteoarthritis     a. knees/hips   Past Surgical History  Procedure Laterality Date  . Lymph node dissection      due to breast cancer  . Mole excision    . Breast lumpectomy      left   Family History  Problem Relation Age of Onset  . Heart disease Father   . Hypertension Father   . Alzheimer's disease Mother   . Breast cancer Mother   . Stroke Brother   . Hypertension Brother   . Prostate cancer Maternal Grandfather   . Colon cancer Maternal Aunt    History   Substance Use Topics  . Smoking status: Never Smoker   . Smokeless tobacco: Never Used  . Alcohol Use: No   OB History   Grav Para Term Preterm Abortions TAB SAB Ect Mult Living                 Review of Systems  Constitutional: Negative.   HENT: Negative for ear pain, hearing loss, postnasal drip, rhinorrhea, sinus pressure and tinnitus.   Eyes: Negative.   Respiratory: Negative for cough, chest tightness and shortness of breath.   Cardiovascular: Negative for chest pain, palpitations and syncope.  Gastrointestinal: Negative for nausea, vomiting, abdominal pain, diarrhea and blood in stool.  Endocrine: Negative for polydipsia, polyphagia and polyuria.  Genitourinary: Negative.   Musculoskeletal: Negative.   Skin: Negative.   Neurological: Positive for dizziness. Negative for syncope, weakness, light-headedness, numbness and headaches.  Psychiatric/Behavioral: Negative for confusion.    Allergies  Cortisone and Statins  Home Medications   Current Outpatient Rx  Name  Route  Sig  Dispense  Refill  . beclomethasone (BECONASE-AQ) 42 MCG/SPRAY nasal spray   Nasal   2 sprays by Nasal route. Dose is for each nostril as needed          . dabigatran (PRADAXA) 150 MG CAPS   Oral   Take  150 mg by mouth. Take one capsule by mouth twice a day          . metoprolol succinate (TOPROL-XL) 25 MG 24 hr tablet   Oral   Take 1 tablet (25 mg total) by mouth daily.   30 tablet   6   . albuterol (PROVENTIL HFA;VENTOLIN HFA) 108 (90 BASE) MCG/ACT inhaler   Inhalation   Inhale 2 puffs into the lungs every 6 (six) hours as needed.         . Blood Glucose Monitoring Suppl (FREESTYLE FREEDOM) KIT      As directed          . chlorpheniramine-HYDROcodone (TUSSIONEX) 10-8 MG/5ML LQCR               . meclizine (ANTIVERT) 25 MG tablet   Oral   Take 1 tablet (25 mg total) by mouth 3 (three) times daily as needed for dizziness.   30 tablet   0   . Multiple Vitamins-Minerals  (CENTRUM SILVER ADULT 50+ PO)   Oral   Take by mouth.          BP 172/106  Pulse 90  Temp(Src) 97.9 F (36.6 C) (Oral)  Resp 18  SpO2 97% Physical Exam  Nursing note and vitals reviewed. Constitutional: She is oriented to person, place, and time. She appears well-developed and well-nourished.  +obese  HENT:  Head: Normocephalic and atraumatic.  Right Ear: Hearing, tympanic membrane, external ear and ear canal normal.  Left Ear: Hearing, tympanic membrane, external ear and ear canal normal.  Nose: Nose normal.  Mouth/Throat: Uvula is midline, oropharynx is clear and moist and mucous membranes are normal.  Eyes: Conjunctivae, EOM and lids are normal. Pupils are equal, round, and reactive to light. Right eye exhibits no nystagmus. Left eye exhibits no nystagmus.  Neck: Trachea normal, normal range of motion, full passive range of motion without pain and phonation normal. Neck supple. No JVD present. Carotid bruit is not present. No mass and no thyromegaly present.  Cardiovascular: Normal heart sounds.  An irregularly irregular rhythm present.  Pulmonary/Chest: Effort normal and breath sounds normal.  Abdominal: Soft. Bowel sounds are normal. She exhibits no distension. There is no tenderness.  Musculoskeletal: Normal range of motion.  Neurological: She is alert and oriented to person, place, and time. She has normal strength. No cranial nerve deficit or sensory deficit. She displays a negative Romberg sign. Coordination and gait normal. GCS eye subscore is 4. GCS verbal subscore is 5. GCS motor subscore is 6.  Symptoms of vertigo reproducible during exam with changes in head position.   Skin: Skin is warm and dry.  Psychiatric: She has a normal mood and affect. Her behavior is normal.    ED Course  Procedures (including critical care time) Labs Review Labs Reviewed  POCT I-STAT, CHEM 8 - Abnormal; Notable for the following:    Potassium 3.3 (*)    Glucose, Bld 118 (*)     Hemoglobin 15.6 (*)    All other components within normal limits  POCT URINALYSIS DIP (DEVICE) - Abnormal; Notable for the following:    Hgb urine dipstick TRACE (*)    All other components within normal limits   Imaging Review No results found.   MDM   1. Benign positional vertigo   ECG: Afib at rate of 104 bpm with no significant changes from previous Istat with mild hypokalemia (3.3) and normal Hg. UA grossly normal. Exam consistent with benign positional vertigo and without  clinical evidence of acute intracerebral process.  Will advise patient and treat with Meclizine as prescribed. Advised close follow up with PCP and Cardiologist if symptoms persist.      [place], [place] 12/15/13 [phone]

## 2013-12-16 NOTE — ED Provider Notes (Signed)
Medical screening examination/treatment/procedure(s) were performed by resident physician or non-physician practitioner and as supervising physician I was immediately available for consultation/collaboration.   Pauline Good MD.   Billy Fischer, MD 12/16/13 [phone]

## 2013-12-22 DIAGNOSIS — E785 Hyperlipidemia, unspecified: Secondary | ICD-10-CM | POA: Diagnosis not present

## 2013-12-22 DIAGNOSIS — I10 Essential (primary) hypertension: Secondary | ICD-10-CM | POA: Diagnosis not present

## 2013-12-22 DIAGNOSIS — H8110 Benign paroxysmal vertigo, unspecified ear: Secondary | ICD-10-CM | POA: Diagnosis not present

## 2014-01-26 DIAGNOSIS — H2510 Age-related nuclear cataract, unspecified eye: Secondary | ICD-10-CM | POA: Diagnosis not present

## 2014-01-26 DIAGNOSIS — H02409 Unspecified ptosis of unspecified eyelid: Secondary | ICD-10-CM | POA: Diagnosis not present

## 2014-01-26 DIAGNOSIS — H18419 Arcus senilis, unspecified eye: Secondary | ICD-10-CM | POA: Diagnosis not present

## 2014-01-28 ENCOUNTER — Telehealth: Payer: Self-pay | Admitting: Cardiology

## 2014-01-28 NOTE — Telephone Encounter (Signed)
Received request from Nurse fax box, documents faxed for surgical clearance. To: [facility]  Fax number: [phone] Attention: [phone]

## 2014-02-01 IMAGING — CR DG HIP COMPLETE 2+V*R*
2 series · 2 of 2 positions shown · non-contrast
Comparison: 09/08/2010 CT pelvis

CLINICAL DATA: Right groin pain for 3 months.

RIGHT HIP - COMPLETE 2+ VIEW

[view not recorded (1 of 2)]
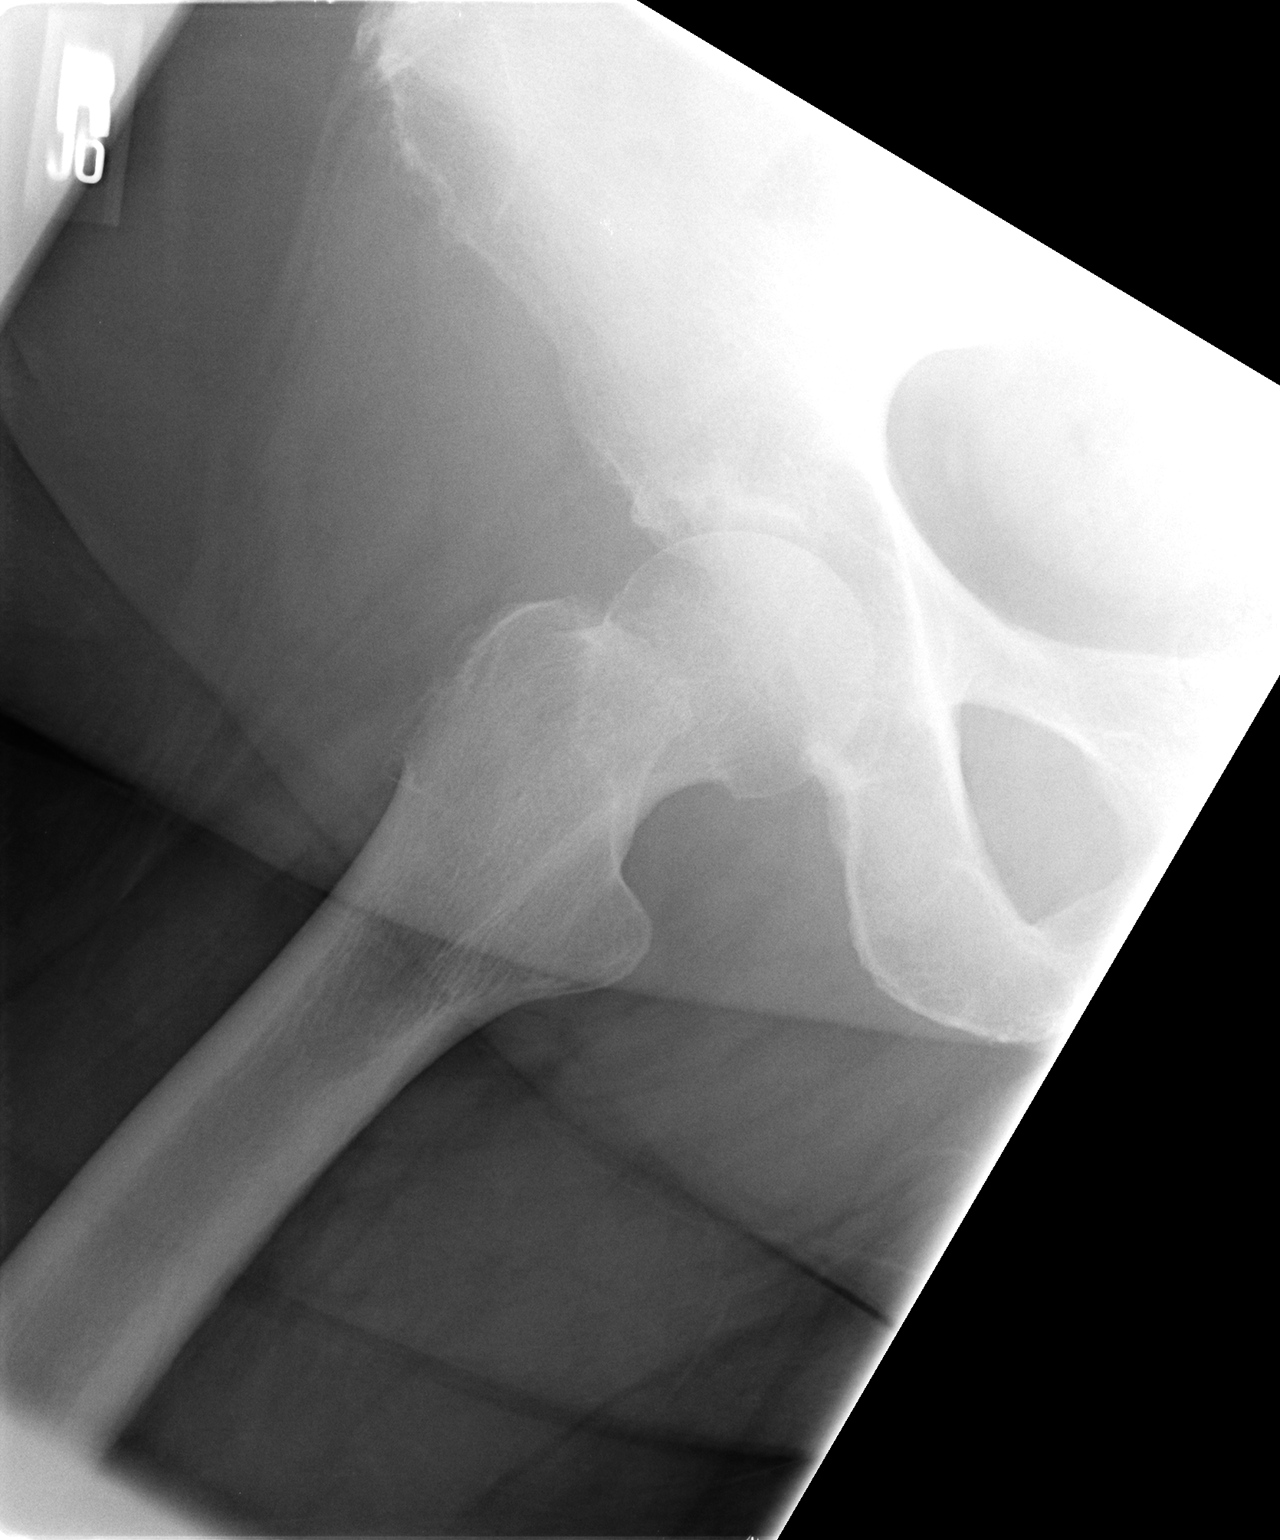

[view not recorded (2 of 2)]
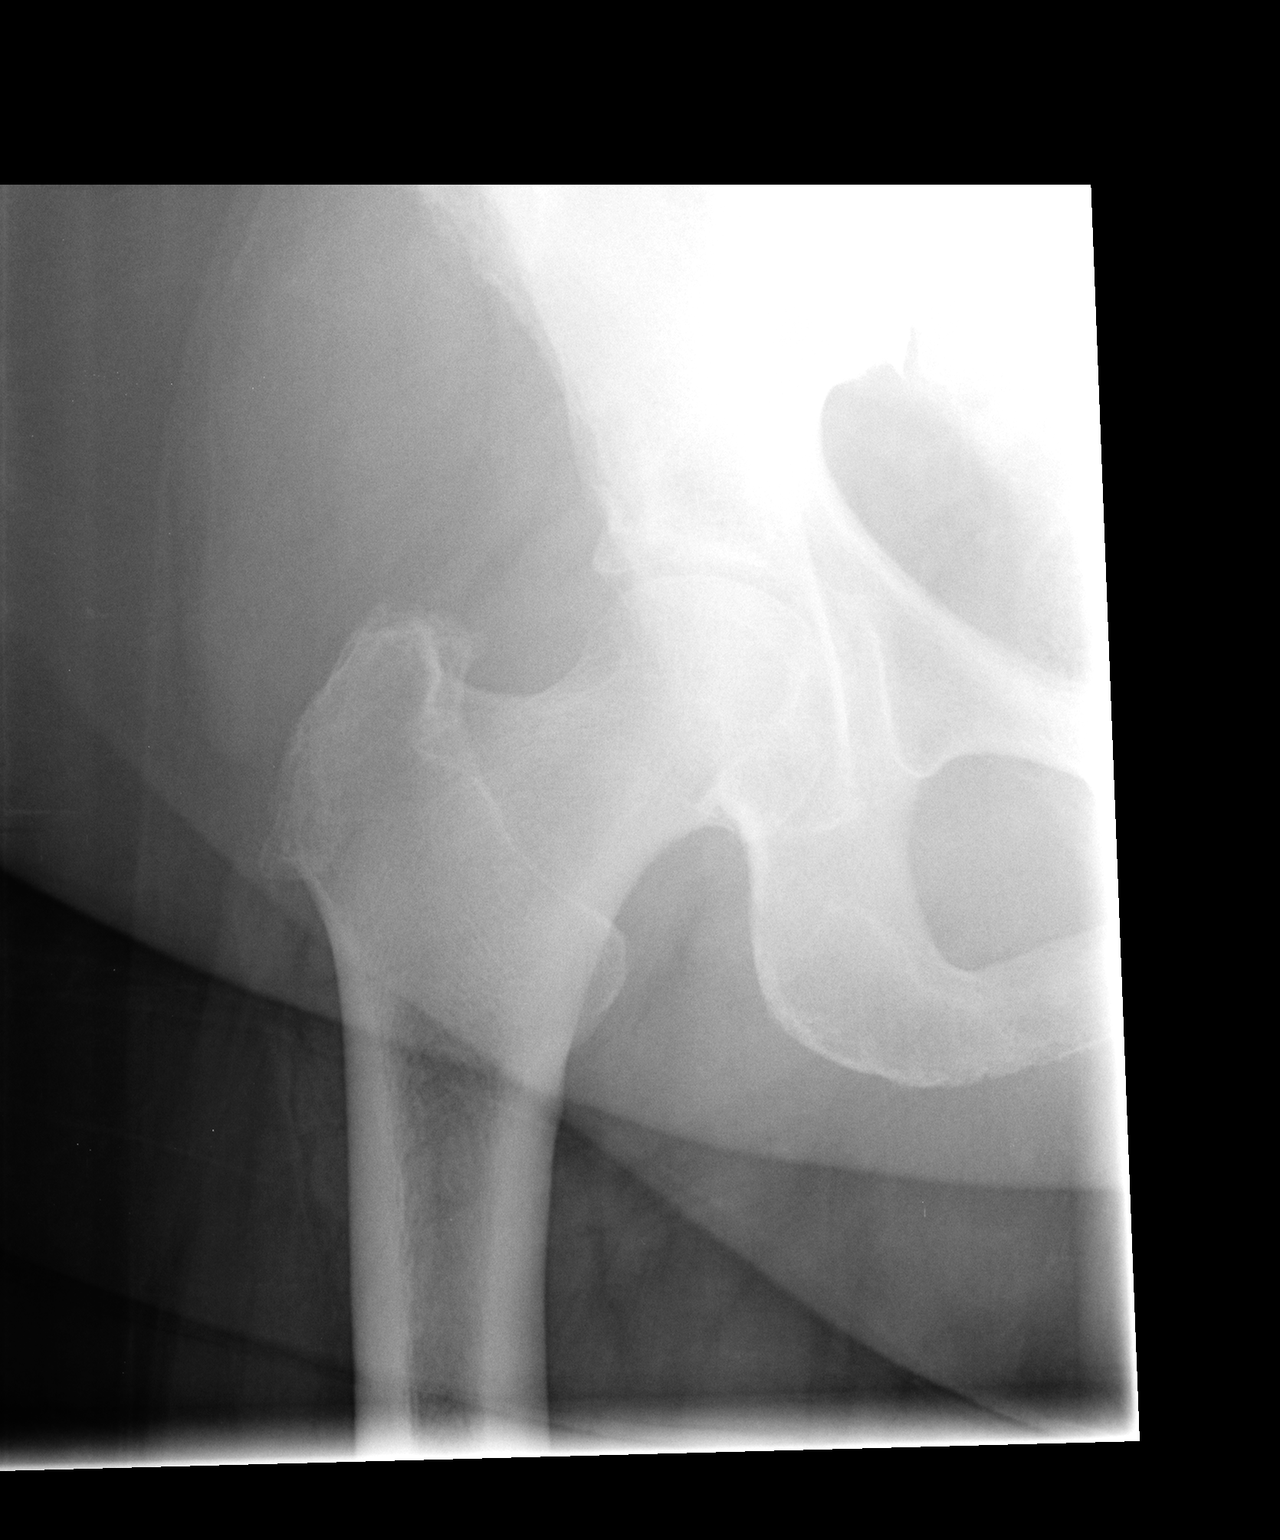

[2 of 2 positions shown; findings below may reference images not displayed]

FINDINGS: Minimal spurring along the femoral head noted.  Mild
degenerative subcortical cyst formation noted in the right
acetabular roof.

No fracture is observed.
IMPRESSION: 1.  Mild degenerative arthropathy of the right hip.

## 2014-02-02 DIAGNOSIS — R7301 Impaired fasting glucose: Secondary | ICD-10-CM | POA: Diagnosis not present

## 2014-02-02 DIAGNOSIS — I4891 Unspecified atrial fibrillation: Secondary | ICD-10-CM | POA: Diagnosis not present

## 2014-02-02 DIAGNOSIS — E785 Hyperlipidemia, unspecified: Secondary | ICD-10-CM | POA: Diagnosis not present

## 2014-02-02 DIAGNOSIS — I10 Essential (primary) hypertension: Secondary | ICD-10-CM | POA: Diagnosis not present

## 2014-02-02 DIAGNOSIS — Z0000 Encounter for general adult medical examination without abnormal findings: Secondary | ICD-10-CM | POA: Diagnosis not present

## 2014-02-09 DIAGNOSIS — H348392 Tributary (branch) retinal vein occlusion, unspecified eye, stable: Secondary | ICD-10-CM | POA: Diagnosis not present

## 2014-02-09 DIAGNOSIS — H2510 Age-related nuclear cataract, unspecified eye: Secondary | ICD-10-CM | POA: Diagnosis not present

## 2014-02-09 DIAGNOSIS — H35369 Drusen (degenerative) of macula, unspecified eye: Secondary | ICD-10-CM | POA: Diagnosis not present

## 2014-02-09 DIAGNOSIS — H3581 Retinal edema: Secondary | ICD-10-CM | POA: Diagnosis not present

## 2014-02-11 DIAGNOSIS — M1990 Unspecified osteoarthritis, unspecified site: Secondary | ICD-10-CM | POA: Diagnosis not present

## 2014-02-11 DIAGNOSIS — E785 Hyperlipidemia, unspecified: Secondary | ICD-10-CM | POA: Diagnosis not present

## 2014-02-11 DIAGNOSIS — R7301 Impaired fasting glucose: Secondary | ICD-10-CM | POA: Diagnosis not present

## 2014-02-11 DIAGNOSIS — I4891 Unspecified atrial fibrillation: Secondary | ICD-10-CM | POA: Diagnosis not present

## 2014-02-11 DIAGNOSIS — I10 Essential (primary) hypertension: Secondary | ICD-10-CM | POA: Diagnosis not present

## 2014-02-16 DIAGNOSIS — H348392 Tributary (branch) retinal vein occlusion, unspecified eye, stable: Secondary | ICD-10-CM | POA: Diagnosis not present

## 2014-02-16 DIAGNOSIS — H3581 Retinal edema: Secondary | ICD-10-CM | POA: Diagnosis not present

## 2014-03-08 ENCOUNTER — Telehealth: Payer: Self-pay | Admitting: Cardiology

## 2014-03-08 NOTE — Telephone Encounter (Signed)
According to documentation - paperwork was faxed back to them May 14.  Checking with MR and will have them re-fax.

## 2014-03-08 NOTE — Telephone Encounter (Signed)
New message    Clearance form fax over May 12.   Will refax over .    Surgery on  6/26.

## 2014-03-08 NOTE — Telephone Encounter (Signed)
Checked with MR - paperwork was faxed 5/14 - Maudie Mercury will re-fax.  Sharyn Lull is aware.

## 2014-03-08 NOTE — Telephone Encounter (Signed)
Received request from Nurse fax box, documents faxed for surgical clearance. To: [facility] Fax number: [phone] Attention: [phone]/km This Was a Refax./ kdm

## 2014-03-11 ENCOUNTER — Telehealth: Payer: Self-pay | Admitting: Cardiology

## 2014-03-11 NOTE — Telephone Encounter (Signed)
OK to hold anticoagulant as needed for the procedure.

## 2014-03-11 NOTE — Telephone Encounter (Signed)
Will forward to MD to ask - nothing documented in notes.

## 2014-03-11 NOTE — Telephone Encounter (Signed)
New problem   Pt need to know if she need to stop taking pradaxa 150mg   for eye surgery. Pt's surgery is 03/15/14. Please advise pt.

## 2014-03-11 NOTE — Telephone Encounter (Signed)
Left message to call back to discuss.

## 2014-03-12 NOTE — Telephone Encounter (Signed)
F/u ° ° °Pt returning your call. Please call pt. °

## 2014-03-12 NOTE — Telephone Encounter (Signed)
Pt aware OK to hold per surgeons instructions but states she was told she doesn't have to hold it.

## 2014-03-15 DIAGNOSIS — H269 Unspecified cataract: Secondary | ICD-10-CM | POA: Diagnosis not present

## 2014-03-15 DIAGNOSIS — H2510 Age-related nuclear cataract, unspecified eye: Secondary | ICD-10-CM | POA: Diagnosis not present

## 2014-03-16 DIAGNOSIS — H2510 Age-related nuclear cataract, unspecified eye: Secondary | ICD-10-CM | POA: Diagnosis not present

## 2014-04-01 ENCOUNTER — Encounter: Payer: Self-pay | Admitting: Internal Medicine

## 2014-04-05 DIAGNOSIS — H269 Unspecified cataract: Secondary | ICD-10-CM | POA: Diagnosis not present

## 2014-04-05 DIAGNOSIS — H2510 Age-related nuclear cataract, unspecified eye: Secondary | ICD-10-CM | POA: Diagnosis not present

## 2014-04-26 ENCOUNTER — Encounter: Payer: Self-pay | Admitting: Physician Assistant

## 2014-05-13 ENCOUNTER — Telehealth: Payer: Self-pay | Admitting: *Deleted

## 2014-05-13 ENCOUNTER — Ambulatory Visit (INDEPENDENT_AMBULATORY_CARE_PROVIDER_SITE_OTHER): Payer: Medicare Other | Admitting: Physician Assistant

## 2014-05-13 ENCOUNTER — Encounter: Payer: Self-pay | Admitting: Physician Assistant

## 2014-05-13 VITALS — BP 146/76 | HR 88 | Ht 64.25 in | Wt 281.1 lb

## 2014-05-13 DIAGNOSIS — D689 Coagulation defect, unspecified: Secondary | ICD-10-CM

## 2014-05-13 DIAGNOSIS — D126 Benign neoplasm of colon, unspecified: Secondary | ICD-10-CM | POA: Diagnosis not present

## 2014-05-13 DIAGNOSIS — Z860100 Personal history of colon polyps, unspecified: Secondary | ICD-10-CM

## 2014-05-13 DIAGNOSIS — Z7901 Long term (current) use of anticoagulants: Secondary | ICD-10-CM

## 2014-05-13 MED ORDER — NA SULFATE-K SULFATE-MG SULF 17.5-3.13-1.6 GM/177ML PO SOLN: 1.0000 | Freq: Once | ORAL | Status: DC

## 2014-05-13 NOTE — Patient Instructions (Signed)
You have been scheduled for a colonoscopy. Please follow written instructions given to you at your visit today.  We have given you a sample prep for the colonoscopy. If you use inhalers (even only as needed), please bring them with you on the day of your procedure. Your physician has requested that you go to www.startemmi.com and enter the access code given to you at your visit today. This web site gives a general overview about your procedure. However, you should still follow specific instructions given to you by our office regarding your preparation for the procedure.  We will call you once we hear from Dr. Percival Spanish regarding the Pradaxa.

## 2014-05-13 NOTE — Progress Notes (Signed)
Reviewed and agree.

## 2014-05-13 NOTE — Progress Notes (Signed)
Subjective:    Patient ID: [name] [name], female    DOB: September 28, 1940, 73 y.o.   MRN: [id number]  HPI  [name] [name] is a pleasant 73 year old African American female known to Dr. Delfin Edis from prior colonoscopy. She had last: Done in 2005 had sigmoid diverticulosis and one 4 mm polyp was removed. Path was consistent with a hyperplastic polyp. She is due for ten-year followup and comes in today to discuss. She has history of atrial fibrillation is maintained on Pradaxa. Also with hypothyroidism and personal history of breast cancer, hypertension, hyperlipidemia, and morbid obesity. She has no current GI complaints, specifically no abdominal pain changes in her bowel habits melena or hematochezia. She has been doing well recently with no cardiac issues. She states she just had bilateral cataract surgeries and retinal surgeries .    Review of Systems  Constitutional: Negative.   HENT: Negative.   Eyes: Negative.   Respiratory: Negative.   Cardiovascular: Negative.   Gastrointestinal: Negative.   Endocrine: Negative.   Genitourinary: Negative.   Musculoskeletal: Negative.   Skin: Negative.   Allergic/Immunologic: Negative.   Neurological: Negative.   Hematological: Negative.   Psychiatric/Behavioral: Negative.    Outpatient Prescriptions Prior to Visit  Medication Sig Dispense Refill  . albuterol (PROVENTIL HFA;VENTOLIN HFA) 108 (90 BASE) MCG/ACT inhaler Inhale 2 puffs into the lungs every 6 (six) hours as needed.      . beclomethasone (BECONASE-AQ) 42 MCG/SPRAY nasal spray 2 sprays by Nasal route. Dose is for each nostril as needed       . Blood Glucose Monitoring Suppl (FREESTYLE FREEDOM) KIT As directed       . chlorpheniramine-HYDROcodone (TUSSIONEX) 10-8 MG/5ML LQCR       . dabigatran (PRADAXA) 150 MG CAPS Take 150 mg by mouth. Take one capsule by mouth twice a day       . meclizine (ANTIVERT) 25 MG tablet Take 1 tablet (25 mg total) by mouth 3 (three) times daily as needed for  dizziness.  30 tablet  0  . metoprolol succinate (TOPROL-XL) 25 MG 24 hr tablet Take 1 tablet (25 mg total) by mouth daily.  30 tablet  6  . Multiple Vitamins-Minerals (CENTRUM SILVER ADULT 50+ PO) Take by mouth.       No facility-administered medications prior to visit.   Allergies  Allergen Reactions  . Cortisone     REACTION: weight gain  . Statins     Myalgias    Patient Active Problem List   Diagnosis Date Noted  . Personal history of colonic polyps 05/13/2014  . Chronic anticoagulation 05/13/2014  . SHORTNESS OF BREATH 07/14/2010  . OBESITY, UNSPECIFIED 07/15/2009  . HYPOTHYROIDISM 07/18/2008  . DYSLIPIDEMIA 07/18/2008  . ESSENTIAL HYPERTENSION, BENIGN 07/18/2008  . ATRIAL FIBRILLATION 07/18/2008  . ASTHMA, UNSPECIFIED, UNSPECIFIED STATUS 07/18/2008  . ADENOCARCINOMA, BREAST, HX OF, T1b, N0, Left lumpectomy 04/1997 07/18/2008   History  Substance Use Topics  . Smoking status: Never Smoker   . Smokeless tobacco: Never Used  . Alcohol Use: No   family history includes Alzheimer's disease in her mother; Breast cancer in her mother; Colon cancer in her maternal aunt; Heart disease in her father; Hypertension in her brother and father; Prostate cancer in her maternal grandfather; Stroke in her brother.     Objective:   Physical Exam   well-developed older American female in no acute distress, pleasant blood pressure 146/76 pulse 88 height 5 foot 4 weight 281 BMI 47. HEENT ;nontraumatic normocephalic EOMI PERRLA  sclera anicteric, Supple; no JVD, Cardiovascular; regular rate and rhythm with S1-S2 pulmonary clear bilaterally, Abdomen ;obese soft nontender nondistended bowel sounds are active there is no palpable mass or hepatosplenomegaly, Rectal; exam not done, Extremities; no clubbing cyanosis or edema skin warm and dry, Psych ;mood and affect appropriate        Assessment & Plan:  #19   73 year old female with history of hyperplastic colon polyps due for ten-year followup  colonoscopy, currently asymptomatic  #2 chronic anticoagulation with Pradaxa  #3 atrial fibrillation  #4 hypertension  #5 morbid obesity  #6 history of breast cancer   Plan ; patient will be scheduled for colonoscopy with Dr. Delfin Edis. Procedure discussed in detail with the patient and she is agreeable to proceed. Relative risks and benefits of stopping anticoagulation also discussed with patient. We will obtain consent from her cardiologist Dr. Percival Spanish for patient hold the production for least 3 days prior to her procedure .

## 2014-05-13 NOTE — Telephone Encounter (Signed)
05/13/2014   RE: Oberia Beaudoin DOB: Mar 30, 1941 MRN: [id number]   Dear Dr. Minus Breeding,    We have scheduled the above patient for an endoscopic procedure. Our records show that she is on anticoagulation therapy.   Please advise as to how long the patient may come off her therapy of Pradaxa prior to the procedure, which is scheduled for 05-26-2014.  Please fax back/ or route the completed form to [facility] at [phone].   Sincerely,    Amy Esterwood PA-C

## 2014-05-17 NOTE — Telephone Encounter (Signed)
I called the patient to advise her per Dr. Percival Spanish, she can hold the Pradaxa on 9-6 and resume it on 9-10.  Patient verbalized understanding the instructions.

## 2014-05-17 NOTE — Telephone Encounter (Signed)
OK to hold anticoagulation as needed for the procedure.

## 2014-05-18 DIAGNOSIS — H348392 Tributary (branch) retinal vein occlusion, unspecified eye, stable: Secondary | ICD-10-CM | POA: Diagnosis not present

## 2014-05-20 ENCOUNTER — Telehealth: Payer: Self-pay | Admitting: Internal Medicine

## 2014-05-20 NOTE — Telephone Encounter (Signed)
The patient just wanted to know if she should take her blood pressure medication before 7 AM on the day of the procedure . I advised her to take her blood pressure and any other medications she normally takes in the am.  She only takes the Toprol and her vitamins.  She knows to hold the Pradaxa on 9-6 and resume it on 9-10.

## 2014-05-26 ENCOUNTER — Ambulatory Visit (AMBULATORY_SURGERY_CENTER): Payer: Medicare Other | Admitting: Internal Medicine

## 2014-05-26 ENCOUNTER — Other Ambulatory Visit: Payer: Self-pay | Admitting: Internal Medicine

## 2014-05-26 ENCOUNTER — Encounter: Payer: Self-pay | Admitting: Internal Medicine

## 2014-05-26 VITALS — BP 181/88 | HR 74 | Temp 97.0°F | Resp 35 | Ht 64.25 in | Wt 281.0 lb

## 2014-05-26 DIAGNOSIS — I4891 Unspecified atrial fibrillation: Secondary | ICD-10-CM | POA: Diagnosis not present

## 2014-05-26 DIAGNOSIS — D125 Benign neoplasm of sigmoid colon: Secondary | ICD-10-CM

## 2014-05-26 DIAGNOSIS — D122 Benign neoplasm of ascending colon: Secondary | ICD-10-CM

## 2014-05-26 DIAGNOSIS — Z86010 Personal history of colonic polyps: Secondary | ICD-10-CM

## 2014-05-26 DIAGNOSIS — D126 Benign neoplasm of colon, unspecified: Secondary | ICD-10-CM

## 2014-05-26 DIAGNOSIS — D124 Benign neoplasm of descending colon: Secondary | ICD-10-CM

## 2014-05-26 MED ORDER — SODIUM CHLORIDE 0.9 % IV SOLN: 500.0000 mL | INTRAVENOUS | Status: DC

## 2014-05-26 NOTE — Progress Notes (Signed)
A/ox3, pleased with MAC, report to RN 

## 2014-05-26 NOTE — Progress Notes (Signed)
Called to room to assist during endoscopic procedure.  Patient ID and intended procedure confirmed with present staff. Received instructions for my participation in the procedure from the performing physician.  

## 2014-05-26 NOTE — Op Note (Signed)
[place]  [address] [place], [address]   COLONOSCOPY PROCEDURE REPORT  PATIENT: [name], [name]  MR#: [id number] BIRTHDATE: June 06, 1941 , 73  yrs. old GENDER: Female ENDOSCOPIST: Lafayette Dragon, MD REFERRED DZ:HGDJM Ashby Dawes, M.D. PROCEDURE DATE:  05/26/2014 PROCEDURE:   Colonoscopy with cold biopsy polypectomy and Colonoscopy with snare polypectomy First Screening Colonoscopy - Avg.  risk and is 50 yrs.  old or older - No.  Prior Negative Screening - Now for repeat screening. N/A  History of Adenoma - Now for follow-up colonoscopy & has been > or = to 3 yrs.  N/A  Polyps Removed Today? Yes. ASA CLASS:   Class II INDICATIONS:Average risk patient for colon cancer and prior colonoscopy in 2004 showed hyperplastic polyp.  Patient has been on Prozac for atrial fibrillation.  He has been on hold. MEDICATIONS: MAC sedation, administered by CRNA and propofol (Diprivan) 300mg  IV  DESCRIPTION OF PROCEDURE:   After the risks benefits and alternatives of the procedure were thoroughly explained, informed consent was obtained.  A digital rectal exam revealed no abnormalities of the rectum.   The     endoscope was introduced through the anus and advanced to the cecum, which was identified by both the appendix and ileocecal valve. No adverse events experienced.   The quality of the prep was Moviprep fair  The instrument was then slowly withdrawn as the colon was fully examined.      COLON FINDINGS: There was moderate diverticulosis noted throughout the entire examined colon with associated tortuosity, muscular hypertrophy and angulation.   Three semi-pedunculated polyps ranging between 3-17mm in size were found.in sigmoid x1 -cold Bx, ascending colon 12 mm x1,. removed with hot snare, and descending colon 5 mm -cold snare  A polypectomy was performed with cold forceps.( sigmoid)  Retroflexed views revealed no abnormalities. The time to cecum=7 minutes  49 seconds.  Withdrawal time=12 minutes 15 seconds.  The scope was withdrawn and the procedure completed. COMPLICATIONS: There were no complications.  ENDOSCOPIC IMPRESSION: 1.   There was moderate diverticulosis noted throughout the entire examined colon 2.   Three semi-pedunculated and sessile  polyps ranging between 3-12mm in size were found; polypectomy was performed with cold forceps see above for details.  RECOMMENDATIONS: 1.  Await pathology results 2.  hold Pradaxa for 5 days hhigh fiber diet Recall colonoscopy pending path report   eSigned:  Lafayette Dragon, MD 05/26/2014 11:04 AM   cc:   PATIENT NAME:  [name] [name], [name] [name] MR#: [id number]

## 2014-05-26 NOTE — Patient Instructions (Signed)
YOU HAD AN ENDOSCOPIC PROCEDURE TODAY AT THE [facility]: Refer to the procedure report that was given to you for any specific questions about what was found during the examination.  If the procedure report does not answer your questions, please call your gastroenterologist to clarify.  If you requested that your care partner not be given the details of your procedure findings, then the procedure report has been included in a sealed envelope for you to review at your convenience later.  YOU SHOULD EXPECT: Some feelings of bloating in the abdomen. Passage of more gas than usual.  Walking can help get rid of the air that was put into your GI tract during the procedure and reduce the bloating. If you had a lower endoscopy (such as a colonoscopy or flexible sigmoidoscopy) you may notice spotting of blood in your stool or on the toilet paper. If you underwent a bowel prep for your procedure, then you may not have a normal bowel movement for a few days.  DIET: Your first meal following the procedure should be a light meal and then it is ok to progress to your normal diet.  A half-sandwich or bowl of soup is an example of a good first meal.  Heavy or fried foods are harder to digest and may make you feel nauseous or bloated.  Likewise meals heavy in dairy and vegetables can cause extra gas to form and this can also increase the bloating.  Drink plenty of fluids but you should avoid alcoholic beverages for 24 hours.  ACTIVITY: Your care partner should take you home directly after the procedure.  You should plan to take it easy, moving slowly for the rest of the day.  You can resume normal activity the day after the procedure however you should NOT DRIVE or use heavy machinery for 24 hours (because of the sedation medicines used during the test).    SYMPTOMS TO REPORT IMMEDIATELY: A gastroenterologist can be reached at any hour.  During normal business hours, 8:30 AM to 5:00 PM Monday through Friday,  call [phone].  After hours and on weekends, please call the GI answering service at [phone] who will take a message and have the physician on call contact you.   Following lower endoscopy (colonoscopy or flexible sigmoidoscopy):  Excessive amounts of blood in the stool  Significant tenderness or worsening of abdominal pains  Swelling of the abdomen that is new, acute  Fever of 100F or higher  FOLLOW UP: If any biopsies were taken you will be contacted by phone or by letter within the next 1-3 weeks.  Call your gastroenterologist if you have not heard about the biopsies in 3 weeks.  Our staff will call the home number listed on your records the next business day following your procedure to check on you and address any questions or concerns that you may have at that time regarding the information given to you following your procedure. This is a courtesy call and so if there is no answer at the home number and we have not heard from you through the emergency physician on call, we will assume that you have returned to your regular daily activities without incident.  SIGNATURES/CONFIDENTIALITY: You and/or your care partner have signed paperwork which will be entered into your electronic medical record.  These signatures attest to the fact that that the information above on your After Visit Summary has been reviewed and is understood.  Full responsibility of the confidentiality of this   discharge information lies with you and/or your care-partner.  Recommendations Await pathology results Hold Pradaxa for 5 days High fiber diet

## 2014-05-26 NOTE — Progress Notes (Addendum)
Patient denies any allergies to eggs or soy. Patient denies any problems with anesthesia/sedation. Patient denies any oxygen use at home and does not take any diet/weight loss medications. Notified Dr.Brodie and CRNA of patient's irregular heart beat, rate 84. Patient denies any heart problems or symptoms at this time. Patient has HX A-Fib. NO new orders at this time. RM

## 2014-05-27 ENCOUNTER — Telehealth: Payer: Self-pay

## 2014-05-27 NOTE — Telephone Encounter (Signed)
  Follow up Call-  Call back number 05/26/2014  Post procedure Call Back phone  # [phone]  Permission to leave phone message Yes     Patient questions:  Do you have a fever, pain , or abdominal swelling? No. Pain Score  0 *  Have you tolerated food without any problems? Yes.    Have you been able to return to your normal activities? Yes.    Do you have any questions about your discharge instructions: Diet   No. Medications  No. Follow up visit  No.  Do you have questions or concerns about your Care? No.  Actions: * If pain score is 4 or above: No action needed, pain <4.

## 2014-06-01 ENCOUNTER — Encounter: Payer: Self-pay | Admitting: Internal Medicine

## 2014-06-02 ENCOUNTER — Encounter: Payer: Self-pay | Admitting: *Deleted

## 2014-07-08 DIAGNOSIS — Z853 Personal history of malignant neoplasm of breast: Secondary | ICD-10-CM | POA: Diagnosis not present

## 2014-07-08 DIAGNOSIS — Z1231 Encounter for screening mammogram for malignant neoplasm of breast: Secondary | ICD-10-CM | POA: Diagnosis not present

## 2014-07-22 DIAGNOSIS — E782 Mixed hyperlipidemia: Secondary | ICD-10-CM | POA: Diagnosis not present

## 2014-07-22 DIAGNOSIS — R7301 Impaired fasting glucose: Secondary | ICD-10-CM | POA: Diagnosis not present

## 2014-07-22 DIAGNOSIS — I10 Essential (primary) hypertension: Secondary | ICD-10-CM | POA: Diagnosis not present

## 2014-07-26 DIAGNOSIS — E161 Other hypoglycemia: Secondary | ICD-10-CM | POA: Diagnosis not present

## 2014-07-26 DIAGNOSIS — R7301 Impaired fasting glucose: Secondary | ICD-10-CM | POA: Diagnosis not present

## 2014-07-26 DIAGNOSIS — E785 Hyperlipidemia, unspecified: Secondary | ICD-10-CM | POA: Diagnosis not present

## 2014-08-03 DIAGNOSIS — H26493 Other secondary cataract, bilateral: Secondary | ICD-10-CM | POA: Diagnosis not present

## 2014-08-03 DIAGNOSIS — H02403 Unspecified ptosis of bilateral eyelids: Secondary | ICD-10-CM | POA: Diagnosis not present

## 2014-08-03 DIAGNOSIS — H34832 Tributary (branch) retinal vein occlusion, left eye: Secondary | ICD-10-CM | POA: Diagnosis not present

## 2014-08-05 DIAGNOSIS — R7301 Impaired fasting glucose: Secondary | ICD-10-CM | POA: Diagnosis not present

## 2014-08-05 DIAGNOSIS — Z23 Encounter for immunization: Secondary | ICD-10-CM | POA: Diagnosis not present

## 2014-08-05 DIAGNOSIS — E782 Mixed hyperlipidemia: Secondary | ICD-10-CM | POA: Diagnosis not present

## 2014-08-05 DIAGNOSIS — I10 Essential (primary) hypertension: Secondary | ICD-10-CM | POA: Diagnosis not present

## 2014-08-30 ENCOUNTER — Other Ambulatory Visit: Payer: Self-pay | Admitting: Obstetrics & Gynecology

## 2014-08-30 DIAGNOSIS — Z124 Encounter for screening for malignant neoplasm of cervix: Secondary | ICD-10-CM | POA: Diagnosis not present

## 2014-09-02 LAB — CYTOLOGY - PAP

## 2014-09-06 DIAGNOSIS — Z853 Personal history of malignant neoplasm of breast: Secondary | ICD-10-CM | POA: Diagnosis not present

## 2014-09-06 DIAGNOSIS — R921 Mammographic calcification found on diagnostic imaging of breast: Secondary | ICD-10-CM | POA: Diagnosis not present

## 2014-09-06 DIAGNOSIS — N63 Unspecified lump in breast: Secondary | ICD-10-CM | POA: Diagnosis not present

## 2014-09-13 ENCOUNTER — Other Ambulatory Visit: Payer: Self-pay | Admitting: Radiology

## 2014-09-13 DIAGNOSIS — D242 Benign neoplasm of left breast: Secondary | ICD-10-CM | POA: Diagnosis not present

## 2014-09-13 DIAGNOSIS — Z853 Personal history of malignant neoplasm of breast: Secondary | ICD-10-CM | POA: Diagnosis not present

## 2014-09-13 DIAGNOSIS — N6032 Fibrosclerosis of left breast: Secondary | ICD-10-CM | POA: Diagnosis not present

## 2014-09-21 DIAGNOSIS — H3581 Retinal edema: Secondary | ICD-10-CM | POA: Diagnosis not present

## 2014-09-21 DIAGNOSIS — H34831 Tributary (branch) retinal vein occlusion, right eye: Secondary | ICD-10-CM | POA: Diagnosis not present

## 2014-10-07 DIAGNOSIS — Z853 Personal history of malignant neoplasm of breast: Secondary | ICD-10-CM | POA: Diagnosis not present

## 2014-11-25 DIAGNOSIS — I10 Essential (primary) hypertension: Secondary | ICD-10-CM | POA: Diagnosis not present

## 2014-11-25 DIAGNOSIS — R7301 Impaired fasting glucose: Secondary | ICD-10-CM | POA: Diagnosis not present

## 2014-11-25 DIAGNOSIS — E782 Mixed hyperlipidemia: Secondary | ICD-10-CM | POA: Diagnosis not present

## 2014-12-02 DIAGNOSIS — R7301 Impaired fasting glucose: Secondary | ICD-10-CM | POA: Diagnosis not present

## 2014-12-02 DIAGNOSIS — I10 Essential (primary) hypertension: Secondary | ICD-10-CM | POA: Diagnosis not present

## 2014-12-02 DIAGNOSIS — E782 Mixed hyperlipidemia: Secondary | ICD-10-CM | POA: Diagnosis not present

## 2015-02-28 DIAGNOSIS — Z853 Personal history of malignant neoplasm of breast: Secondary | ICD-10-CM | POA: Diagnosis not present

## 2015-03-25 DIAGNOSIS — Z780 Asymptomatic menopausal state: Secondary | ICD-10-CM | POA: Diagnosis not present

## 2015-03-25 DIAGNOSIS — M8580 Other specified disorders of bone density and structure, unspecified site: Secondary | ICD-10-CM | POA: Diagnosis not present

## 2015-03-25 DIAGNOSIS — M810 Age-related osteoporosis without current pathological fracture: Secondary | ICD-10-CM | POA: Diagnosis not present

## 2015-04-21 DIAGNOSIS — E782 Mixed hyperlipidemia: Secondary | ICD-10-CM | POA: Diagnosis not present

## 2015-04-21 DIAGNOSIS — R7301 Impaired fasting glucose: Secondary | ICD-10-CM | POA: Diagnosis not present

## 2015-04-21 DIAGNOSIS — I10 Essential (primary) hypertension: Secondary | ICD-10-CM | POA: Diagnosis not present

## 2015-04-21 DIAGNOSIS — I872 Venous insufficiency (chronic) (peripheral): Secondary | ICD-10-CM | POA: Diagnosis not present

## 2015-04-21 DIAGNOSIS — M150 Primary generalized (osteo)arthritis: Secondary | ICD-10-CM | POA: Diagnosis not present

## 2015-04-21 DIAGNOSIS — E049 Nontoxic goiter, unspecified: Secondary | ICD-10-CM | POA: Diagnosis not present

## 2015-04-21 DIAGNOSIS — I482 Chronic atrial fibrillation: Secondary | ICD-10-CM | POA: Diagnosis not present

## 2015-04-28 DIAGNOSIS — E782 Mixed hyperlipidemia: Secondary | ICD-10-CM | POA: Diagnosis not present

## 2015-04-28 DIAGNOSIS — I482 Chronic atrial fibrillation: Secondary | ICD-10-CM | POA: Diagnosis not present

## 2015-04-28 DIAGNOSIS — I10 Essential (primary) hypertension: Secondary | ICD-10-CM | POA: Diagnosis not present

## 2015-04-28 DIAGNOSIS — E785 Hyperlipidemia, unspecified: Secondary | ICD-10-CM | POA: Diagnosis not present

## 2015-06-21 DIAGNOSIS — Z23 Encounter for immunization: Secondary | ICD-10-CM | POA: Diagnosis not present

## 2015-08-02 ENCOUNTER — Ambulatory Visit (INDEPENDENT_AMBULATORY_CARE_PROVIDER_SITE_OTHER): Payer: Medicare Other | Admitting: Cardiology

## 2015-08-02 ENCOUNTER — Encounter: Payer: Self-pay | Admitting: Cardiology

## 2015-08-02 VITALS — BP 148/100 | HR 114 | Ht 65.0 in | Wt 271.6 lb

## 2015-08-02 DIAGNOSIS — I480 Paroxysmal atrial fibrillation: Secondary | ICD-10-CM | POA: Diagnosis not present

## 2015-08-02 MED ORDER — METOPROLOL SUCCINATE ER 50 MG PO TB24: 50.0000 mg | ORAL_TABLET | Freq: Every day | ORAL | Status: DC

## 2015-08-02 NOTE — Patient Instructions (Signed)
Your physician wants you to follow-up in: 1 Year. You will receive a reminder letter in the mail two months in advance. If you don't receive a letter, please call our office to schedule the follow-up appointment.  Your physician has recommended you make the following change in your medication: Increase Metoprolol Succ 50 mg daily

## 2015-08-02 NOTE — Progress Notes (Signed)
HPI The patient presents for followup of atrial fibrillation. She's feeling well. The patient denies any new symptoms such as chest discomfort, neck or arm discomfort.  She was in the emergency room with dizziness and was diagnosed with benign positional vertigo.  There has been no new shortness of breath, PND or orthopnea. There have been no reported palpitations, presyncope or syncope.    She exercises routinely.   She does not feel her heart rate.     Allergies  Allergen Reactions  . Cortisone     REACTION: weight gain  . Statins     Myalgias     Current Outpatient Prescriptions  Medication Sig Dispense Refill  . albuterol (PROVENTIL HFA;VENTOLIN HFA) 108 (90 BASE) MCG/ACT inhaler Inhale 2 puffs into the lungs every 6 (six) hours as needed.    . chlorpheniramine-HYDROcodone (TUSSIONEX) 10-8 MG/5ML LQCR     . colchicine 0.6 MG tablet Take 0.6 mg by mouth daily. Take 1 tab daily as needed  2  . dabigatran (PRADAXA) 150 MG CAPS Take 150 mg by mouth. Take one capsule by mouth twice a day     . meclizine (ANTIVERT) 25 MG tablet Take 1 tablet (25 mg total) by mouth 3 (three) times daily as needed for dizziness. 30 tablet 0  . metoprolol succinate (TOPROL-XL) 25 MG 24 hr tablet Take 1 tablet (25 mg total) by mouth daily. 30 tablet 6  . Multiple Vitamins-Minerals (CENTRUM SILVER ADULT 50+ PO) Take by mouth.     No current facility-administered medications for this visit.    Past Medical History  Diagnosis Date  . Atrial fibrillation ([place])     a. 11/2006 Echo: EF 50-55%, mild MR;  b. On Pradaxa & rate control  . Hypothyroidism   . Dyslipidemia   . Asthma   . Breast cancer ([place])     with lymph node resection (16 nodes)  . Diverticulosis   . Hyperplastic colon polyp 2005  . Arthritis   . Gallstones   . Obesity   . Hemorrhoids   . Osteoarthritis     a. knees/hips  . Anemia     Past Surgical History  Procedure Laterality Date  . Lymph node dissection      due to breast  cancer  . Mole excision    . Breast lumpectomy      left   ROS:  As stated in the HPI and negative for all other systems.  PHYSICAL EXAM BP 148/100 mmHg  Pulse 114  Ht 5\' 5"  (1.651 m)  Wt 271 lb 9 oz (123.18 kg)  BMI 45.19 kg/m2 GENERAL:  Well appearing NECK:  No jugular venous distention, waveform within normal limits, carotid upstroke brisk and symmetric, no bruits, no thyromegaly LUNGS:  Clear to auscultation bilaterally CHEST:  Unremarkable HEART:  PMI not displaced or sustained,S1 and S2 within normal limits, no 123456 clicks, no rubs, no murmurs, irregular ABD:  Flat, positive bowel sounds normal in frequency in pitch, no bruits, no rebound, no guarding, no midline pulsatile mass, no hepatomegaly, no splenomegaly EXT:  2 plus pulses throughout, no edema, no cyanosis no clubbing  EKG:  Atrial fibrillation, rate 114, axis within normal limits, intervals within normal limits, no acute ST-T wave changes. 08/02/2015   ASSESSMENT AND PLAN  Atrial fibrillation:  Her BP is elevated and HR increased.  I will increase Toprol to 50 mg daily.  [name] has a CHA2DS2 - VASc score of 2 with a risk of  stroke of 2%.  She came along with [name] [name] and we will look into the least expensive alternative.  HTN:  Her blood pressure at home is systolic controlled.  However I will make the change as above.   HL: She has recently been tried on multiple statins. She's intolerant of them all.  No change in therapy is indicated.  OBESITY:   She lost 10 pounds and I congratulated her on this.

## 2015-08-03 ENCOUNTER — Telehealth: Payer: Self-pay | Admitting: *Deleted

## 2015-08-03 DIAGNOSIS — Z79899 Other long term (current) drug therapy: Secondary | ICD-10-CM | POA: Diagnosis not present

## 2015-08-03 LAB — BASIC METABOLIC PANEL
BUN: 14 mg/dL (ref 7–25)
CHLORIDE: 101 mmol/L (ref 98–110)
CO2: 31 mmol/L (ref 20–31)
Calcium: 9.3 mg/dL (ref 8.6–10.4)
Creat: 0.84 mg/dL (ref 0.60–0.93)
GLUCOSE: 96 mg/dL (ref 65–99)
POTASSIUM: 4 mmol/L (ref 3.5–5.3)
Sodium: 142 mmol/L (ref 135–146)

## 2015-08-03 NOTE — Telephone Encounter (Signed)
Pt need blood work, BMP as per Erasmo Downer, labs was ordered pt will pick up at front desk.

## 2015-08-08 ENCOUNTER — Telehealth: Payer: Self-pay | Admitting: Pharmacist Clinician (PhC)/ Clinical Pharmacy Specialist

## 2015-08-08 NOTE — Telephone Encounter (Signed)
OK to make the switch as suggested.

## 2015-08-08 NOTE — Telephone Encounter (Signed)
Pt has been on Pradaxa 150 mg bid.  Will not be covered on insurance in 2017.  BMET drawn last week, shows SCr of 0.84.  Would recommend switching her to Eliquis 5 mg bid, with first dose 12 hours after last dose of Pradaxa.

## 2015-08-09 NOTE — Telephone Encounter (Signed)
Spoke with pt, she stated she will switch to Eliquis the beginning of the year, told her when she ready to switch to give our office a call, so we can send prescription into pharmacy.

## 2015-08-16 ENCOUNTER — Other Ambulatory Visit: Payer: Self-pay | Admitting: Cardiology

## 2015-08-16 NOTE — Telephone Encounter (Signed)
Returned call to patient no answer.LMTC. 

## 2015-08-16 NOTE — Telephone Encounter (Signed)
° °*  STAT* If patient is at the pharmacy, call can be transferred to refill team.   1. Which medications need to be refilled? (please list name of each medication and dose if known) Needs a New Prescription for Eliquis   2. Which pharmacy/location (including street and city if local pharmacy) is medication to be sent to?[facility] on [address]   3. Do they need a 30 day or 90 day supply? [place]

## 2015-08-17 NOTE — Telephone Encounter (Signed)
Received call from patient 08/16/15.She stated she will start Eliquis when finished with Pradaxa.Eliquis 5 mg take twice a day first dose 12 hours after last dose of Pradaxa.Samples of Eliquis 5 mg left at front desk of [facility] office.

## 2015-09-22 DIAGNOSIS — Z1231 Encounter for screening mammogram for malignant neoplasm of breast: Secondary | ICD-10-CM | POA: Diagnosis not present

## 2015-09-22 DIAGNOSIS — Z853 Personal history of malignant neoplasm of breast: Secondary | ICD-10-CM | POA: Diagnosis not present

## 2015-10-13 ENCOUNTER — Telehealth: Payer: Self-pay

## 2015-10-13 NOTE — Telephone Encounter (Signed)
Call Documentation      [name] [name] at 08/09/2015 8:27 AM     Status: Signed       Expand All Collapse All   Spoke with pt, she stated she will switch to Eliquis the beginning of the year, told her when she ready to switch to give our office a call, so we can send prescription into pharmacy.

## 2015-10-14 DIAGNOSIS — Z853 Personal history of malignant neoplasm of breast: Secondary | ICD-10-CM | POA: Diagnosis not present

## 2015-10-14 MED ORDER — APIXABAN 5 MG PO TABS: 5.0000 mg | ORAL_TABLET | Freq: Two times a day (BID) | ORAL | Status: DC

## 2015-10-14 NOTE — Telephone Encounter (Signed)
Pradaxa no longer covered on plan, will switch to Eliquis.  Explained to patient to take Pradaxa until gone, then 12 hours later start Eliquis 5 mg bid.  Patient voiced understanding.

## 2015-10-20 ENCOUNTER — Other Ambulatory Visit: Payer: Self-pay | Admitting: Surgery

## 2015-11-03 DIAGNOSIS — H348322 Tributary (branch) retinal vein occlusion, left eye, stable: Secondary | ICD-10-CM | POA: Diagnosis not present

## 2015-11-03 DIAGNOSIS — H26491 Other secondary cataract, right eye: Secondary | ICD-10-CM | POA: Diagnosis not present

## 2015-11-29 DIAGNOSIS — Z6841 Body Mass Index (BMI) 40.0 and over, adult: Secondary | ICD-10-CM | POA: Diagnosis not present

## 2015-11-29 DIAGNOSIS — K5900 Constipation, unspecified: Secondary | ICD-10-CM | POA: Diagnosis not present

## 2015-11-29 DIAGNOSIS — N951 Menopausal and female climacteric states: Secondary | ICD-10-CM | POA: Diagnosis not present

## 2015-12-27 DIAGNOSIS — H1131 Conjunctival hemorrhage, right eye: Secondary | ICD-10-CM | POA: Diagnosis not present

## 2015-12-27 DIAGNOSIS — H11421 Conjunctival edema, right eye: Secondary | ICD-10-CM | POA: Diagnosis not present

## 2016-02-17 ENCOUNTER — Other Ambulatory Visit: Payer: Self-pay

## 2016-02-17 MED ORDER — METOPROLOL SUCCINATE ER 50 MG PO TB24: 50.0000 mg | ORAL_TABLET | Freq: Every day | ORAL | Status: DC

## 2016-04-05 ENCOUNTER — Other Ambulatory Visit: Payer: Self-pay | Admitting: Cardiology

## 2016-05-31 DIAGNOSIS — H43811 Vitreous degeneration, right eye: Secondary | ICD-10-CM | POA: Diagnosis not present

## 2016-08-10 ENCOUNTER — Other Ambulatory Visit: Payer: Self-pay | Admitting: Cardiology

## 2016-08-13 ENCOUNTER — Other Ambulatory Visit: Payer: Self-pay | Admitting: Cardiology

## 2016-08-13 NOTE — Telephone Encounter (Signed)
Rx(s) sent to pharmacy electronically.  

## 2016-08-14 ENCOUNTER — Other Ambulatory Visit: Payer: Self-pay | Admitting: Cardiology

## 2016-08-20 ENCOUNTER — Ambulatory Visit: Payer: Medicare Other | Admitting: Cardiology

## 2016-08-22 NOTE — Progress Notes (Signed)
HPI The patient presents for followup of atrial fibrillation.  At the last visit her BP was up and I increased her metoprolol.  Since I last saw her her husband of 4 years died 5 weeks ago.  She is naturally sad.  The patient denies any new symptoms such as chest discomfort, neck or arm discomfort. There has been no new shortness of breath, PND or orthopnea. There have been no reported palpitations, presyncope or syncope.   She is not getting out as much with her husbands illness and death.  The patient denies any new symptoms such as chest discomfort, neck or arm discomfort. There has been no new shortness of breath, PND or orthopnea. There have been no reported palpitations, presyncope or syncope.   Allergies  Allergen Reactions  . Cortisone     REACTION: weight gain  . Statins     Myalgias     Current Outpatient Prescriptions  Medication Sig Dispense Refill  . albuterol (PROVENTIL HFA;VENTOLIN HFA) 108 (90 BASE) MCG/ACT inhaler Inhale 2 puffs into the lungs every 6 (six) hours as needed.    . chlorpheniramine-HYDROcodone (TUSSIONEX) 10-8 MG/5ML LQCR     . colchicine 0.6 MG tablet Take 0.6 mg by mouth daily. Take 1 tab daily as needed  2  . ELIQUIS 5 MG TABS tablet TAKE 1 TABLET BY MOUTH TWICE A DAY 60 tablet 5  . meclizine (ANTIVERT) 25 MG tablet Take 1 tablet (25 mg total) by mouth 3 (three) times daily as needed for dizziness. 30 tablet 0  . metoprolol succinate (TOPROL-XL) 50 MG 24 hr tablet TAKE 1 TABLET BY MOUTH DAILY 90 tablet 0  . Multiple Vitamins-Minerals (CENTRUM SILVER ADULT 50+ PO) Take by mouth.     No current facility-administered medications for this visit.     Past Medical History:  Diagnosis Date  . Anemia   . Arthritis   . Asthma   . Atrial fibrillation ([place])    a. 11/2006 Echo: EF 50-55%, mild MR;  b. On Pradaxa & rate control  . Breast cancer ([place])    with lymph node resection (16 nodes)  . Diverticulosis   . Dyslipidemia   . Gallstones   . Hemorrhoids    . Hyperplastic colon polyp 2005  . Hypothyroidism   . Obesity   . Osteoarthritis    a. knees/hips    Past Surgical History:  Procedure Laterality Date  . BREAST LUMPECTOMY     left  . LYMPH NODE DISSECTION     due to breast cancer  . mole excision     ROS:     As stated in the HPI and negative for all other systems.  PHYSICAL EXAM BP (!) 174/86   Pulse 96   Ht 5\' 6"  (1.676 m)   Wt 258 lb 9.6 oz (117.3 kg)   BMI 41.74 kg/m  GENERAL:  Well appearing NECK:  No jugular venous distention, waveform within normal limits, carotid upstroke brisk and symmetric, no bruits, no thyromegaly LUNGS:  Clear to auscultation bilaterally CHEST:  Unremarkable HEART:  PMI not displaced or sustained,S1 and S2 within normal limits, no 123456 clicks, no rubs, no murmurs, irregular ABD:  Flat, positive bowel sounds normal in frequency in pitch, no bruits, no rebound, no guarding, no midline pulsatile mass, no hepatomegaly, no splenomegaly EXT:  2 plus pulses throughout, no edema, no cyanosis no clubbing  EKG:  Atrial fibrillation, rate 96, axis within normal limits, intervals within normal limits, no acute ST-T wave changes.  08/23/2016   ASSESSMENT AND PLAN  Atrial fibrillation:  Her BP is elevated and HR increased.  I will increase Toprol to 50 mg daily.  [name] has a CHA2DS2 - VASc score of 2 with a risk of stroke of 2%.    HTN:  Her blood pressure is not at target.  She needs to keep a BP diary and see an APP in 2 months with these data.   HL: She has recently been tried on multiple statins. She's intolerant of them all.  No change in therapy is indicated.  I will check a lipid profile.   OBESITY:   The patient understands the need to lose weight with diet and exercise. We have discussed specific strategies for this.  She has lost 42 lbs!!!

## 2016-08-23 ENCOUNTER — Ambulatory Visit (INDEPENDENT_AMBULATORY_CARE_PROVIDER_SITE_OTHER): Payer: Medicare Other | Admitting: Cardiology

## 2016-08-23 ENCOUNTER — Encounter: Payer: Self-pay | Admitting: Cardiology

## 2016-08-23 VITALS — BP 174/86 | HR 96 | Ht 66.0 in | Wt 258.6 lb

## 2016-08-23 DIAGNOSIS — I10 Essential (primary) hypertension: Secondary | ICD-10-CM

## 2016-08-23 DIAGNOSIS — E785 Hyperlipidemia, unspecified: Secondary | ICD-10-CM | POA: Diagnosis not present

## 2016-08-23 DIAGNOSIS — I482 Chronic atrial fibrillation: Secondary | ICD-10-CM | POA: Diagnosis not present

## 2016-08-23 DIAGNOSIS — I4821 Permanent atrial fibrillation: Secondary | ICD-10-CM

## 2016-08-23 LAB — COMPREHENSIVE METABOLIC PANEL
ALBUMIN: 3.7 g/dL (ref 3.6–5.1)
ALT: 9 U/L (ref 6–29)
AST: 15 U/L (ref 10–35)
Alkaline Phosphatase: 61 U/L (ref 33–130)
BILIRUBIN TOTAL: 0.8 mg/dL (ref 0.2–1.2)
BUN: 11 mg/dL (ref 7–25)
CALCIUM: 9.2 mg/dL (ref 8.6–10.4)
CHLORIDE: 104 mmol/L (ref 98–110)
CO2: 33 mmol/L — ABNORMAL HIGH (ref 20–31)
CREATININE: 0.81 mg/dL (ref 0.60–0.93)
Glucose, Bld: 79 mg/dL (ref 65–99)
Potassium: 4.5 mmol/L (ref 3.5–5.3)
SODIUM: 144 mmol/L (ref 135–146)
TOTAL PROTEIN: 6.5 g/dL (ref 6.1–8.1)

## 2016-08-23 LAB — LIPID PANEL
CHOLESTEROL: 157 mg/dL (ref <200)
HDL: 50 mg/dL — AB (ref >50)
LDL Cholesterol: 98 mg/dL (ref <100)
TRIGLYCERIDES: 47 mg/dL (ref <150)
Total CHOL/HDL Ratio: 3.1 Ratio (ref <5.0)
VLDL: 9 mg/dL (ref <30)

## 2016-08-23 LAB — CBC
HEMATOCRIT: 40.9 % (ref 35.0–45.0)
HEMOGLOBIN: 13.1 g/dL (ref 11.7–15.5)
MCH: 26.3 pg — AB (ref 27.0–33.0)
MCHC: 32 g/dL (ref 32.0–36.0)
MCV: 82.1 fL (ref 80.0–100.0)
MPV: 10.4 fL (ref 7.5–12.5)
Platelets: 221 10*3/uL (ref 140–400)
RBC: 4.98 MIL/uL (ref 3.80–5.10)
RDW: 15.2 % — ABNORMAL HIGH (ref 11.0–15.0)
WBC: 4.6 10*3/uL (ref 3.8–10.8)

## 2016-08-23 NOTE — Patient Instructions (Signed)
Medication Instructions:  Continue current medications  Labwork: CBC, CMP, and Fasting Lipids  Testing/Procedures: None Ordered  Follow-Up: Your physician recommends that you schedule a follow-up appointment in: 2 Months with APP  Any Other Special Instructions Will Be Listed Below (If Applicable).     If you need a refill on your cardiac medications before your next appointment, please call your pharmacy.

## 2016-08-27 ENCOUNTER — Telehealth: Payer: Self-pay | Admitting: Cardiology

## 2016-08-27 NOTE — Telephone Encounter (Signed)
Mrs. Ardis Hughs -Wynetta Emery is returning a call about her test results

## 2016-08-28 NOTE — Telephone Encounter (Signed)
F/u message  Pt call requesting to speak with RN about results. Please call back to discuss

## 2016-08-28 NOTE — Telephone Encounter (Signed)
Labs OK. No change in therapy. She does not tolerate statins. Call [name] [name] with the results   Pt notified she states that her BP is down now to 142/82

## 2016-08-28 NOTE — Telephone Encounter (Signed)
Pt,calling again today. She says she is returning a call from yesterday.

## 2016-09-27 DIAGNOSIS — Z853 Personal history of malignant neoplasm of breast: Secondary | ICD-10-CM | POA: Diagnosis not present

## 2016-09-27 DIAGNOSIS — Z1231 Encounter for screening mammogram for malignant neoplasm of breast: Secondary | ICD-10-CM | POA: Diagnosis not present

## 2016-10-09 ENCOUNTER — Other Ambulatory Visit: Payer: Self-pay | Admitting: Cardiology

## 2016-10-15 ENCOUNTER — Other Ambulatory Visit: Payer: Self-pay | Admitting: Cardiology

## 2016-10-25 ENCOUNTER — Encounter: Payer: Self-pay | Admitting: Cardiology

## 2016-10-25 ENCOUNTER — Ambulatory Visit (INDEPENDENT_AMBULATORY_CARE_PROVIDER_SITE_OTHER): Payer: Medicare Other | Admitting: Cardiology

## 2016-10-25 ENCOUNTER — Ambulatory Visit: Payer: Medicare Other | Admitting: Physician Assistant

## 2016-10-25 VITALS — BP 156/90 | HR 90 | Ht 66.0 in | Wt 263.0 lb

## 2016-10-25 DIAGNOSIS — I10 Essential (primary) hypertension: Secondary | ICD-10-CM | POA: Diagnosis not present

## 2016-10-25 DIAGNOSIS — I482 Chronic atrial fibrillation: Secondary | ICD-10-CM

## 2016-10-25 DIAGNOSIS — Z853 Personal history of malignant neoplasm of breast: Secondary | ICD-10-CM

## 2016-10-25 DIAGNOSIS — F4321 Adjustment disorder with depressed mood: Secondary | ICD-10-CM

## 2016-10-25 DIAGNOSIS — Z7901 Long term (current) use of anticoagulants: Secondary | ICD-10-CM | POA: Diagnosis not present

## 2016-10-25 DIAGNOSIS — Z6841 Body Mass Index (BMI) 40.0 and over, adult: Secondary | ICD-10-CM

## 2016-10-25 DIAGNOSIS — I4821 Permanent atrial fibrillation: Secondary | ICD-10-CM

## 2016-10-25 NOTE — Progress Notes (Signed)
10/25/2016 [name]   1941-04-11  [id number]  Primary Physician Merrilee Seashore, MD Primary Cardiologist: Dr Percival Spanish  HPI:  76 y/o female followed by Dr Percival Spanish with a history of CAF, HTN, and morbid obesity. Her husband passed in Oct and she has been struggling a bit but tells me she is doing better. Dr Percival Spanish has been concerned about her B/P but any change has been put til she feels she is over her husbands death. She is in the office today for routine check. She says she is active during the day with friends and shopping. She is unaware of her AF.   Current Outpatient Prescriptions  Medication Sig Dispense Refill  . chlorpheniramine-HYDROcodone (TUSSIONEX) 10-8 MG/5ML LQCR     . Cholecalciferol (VITAMIN D3) 5000 units CAPS Take 1 capsule by mouth daily.    . colchicine 0.6 MG tablet Take 0.6 mg by mouth as needed.   2  . ELIQUIS 5 MG TABS tablet TAKE 1 TABLET BY MOUTH TWICE A DAY 60 tablet 5  . meclizine (ANTIVERT) 25 MG tablet Take 1 tablet (25 mg total) by mouth 3 (three) times daily as needed for dizziness. 30 tablet 0  . metoprolol succinate (TOPROL-XL) 50 MG 24 hr tablet TAKE 1 TABLET BY MOUTH DAILY 90 tablet 0  . Multiple Vitamins-Minerals (CENTRUM SILVER ADULT 50+ PO) Take by mouth.    . Vitamin D, Ergocalciferol, (DRISDOL) 50000 units CAPS capsule Take 50,000 Units by mouth every 7 (seven) days.     No current facility-administered medications for this visit.     Allergies  Allergen Reactions  . Cortisone Other (See Comments)    REACTION: weight gain  . Statins Other (See Comments)    Myalgias     Social History   Social History  . Marital status: Married    Spouse name: N/A  . Number of children: N/A  . Years of education: N/A   Occupational History  . Retired Pharmacist, hospital    Social History Main Topics  . Smoking status: Never Smoker  . Smokeless tobacco: Never Used  . Alcohol use No  . Drug use: No  . Sexual activity: Not on file    Other Topics Concern  . Not on file   Social History Narrative  . No narrative on file     Review of Systems: General: negative for chills, fever, night sweats or weight changes.  Cardiovascular: negative for chest pain, dyspnea on exertion, edema, orthopnea, palpitations, paroxysmal nocturnal dyspnea or shortness of breath Dermatological: negative for rash Respiratory: negative for cough or wheezing Urologic: negative for hematuria Abdominal: negative for nausea, vomiting, diarrhea, bright red blood per rectum, melena, or hematemesis Neurologic: negative for visual changes, syncope, or dizziness All other systems reviewed and are otherwise negative except as noted above.    Blood pressure (!) 156/90, pulse 90, height 5\' 6"  (1.676 m), weight 263 lb (119.3 kg), SpO2 97 %.  General appearance: alert, cooperative, no distress and morbidly obese Neck: no carotid bruit and no JVD Lungs: clear to auscultation bilaterally Heart: irregularly irregular rhythm Extremities: extremities normal, atraumatic, no cyanosis or edema Skin: Skin color, texture, turgor normal. No rashes or lesions Neurologic: Grossly normal   ASSESSMENT AND PLAN:   ESSENTIAL HYPERTENSION, BENIGN Her B/P is not optimally controlled but at this time she did not want to make any changes  Permanent atrial fibrillation (HCC) Tolerated well- rate controlled  Morbid obesity with BMI of 40.0-44.9, adult (HCC) BMI 42  Chronic  anticoagulation CHADs VASc=4. Pt is on Eliquis  Personal history of malignant neoplasm of breast ADENOCARCINOMA, BREAST, HX OF, T1b, N0, Left lumpectomy 04/1997  Situational depression Pt's husband passed Oct 2017- they had been married only 4 yrs   PLAN  I asked to start walking daily and to limit her fluid intake a little, I think she has been pushing fluids. We agreed fro her to come back in three months. At that time if she needs additional medication for HTN I would start Amlodipine  or an ARB.   Kerin Ransom PA-C 10/25/2016 2:33 PM

## 2016-10-25 NOTE — Assessment & Plan Note (Signed)
Tolerated well- rate controlled

## 2016-10-25 NOTE — Assessment & Plan Note (Signed)
Pt's husband passed Oct 2017- they had been married only 4 yrs

## 2016-10-25 NOTE — Assessment & Plan Note (Signed)
Her B/P is not optimally controlled but at this time she did not want to make any changes

## 2016-10-25 NOTE — Assessment & Plan Note (Signed)
CHADs VASc=4. Pt is on Eliquis

## 2016-10-25 NOTE — Assessment & Plan Note (Signed)
BMI 42 

## 2016-10-25 NOTE — Assessment & Plan Note (Signed)
ADENOCARCINOMA, BREAST, HX OF, T1b, N0, Left lumpectomy 04/1997

## 2016-10-25 NOTE — Patient Instructions (Signed)
Medication Instructions:  Your physician recommends that you continue on your current medications as directed. Please refer to the Current Medication list given to you today.   If you need a refill on your cardiac medications before your next appointment, please call your pharmacy.   Follow-Up: Your physician recommends that you schedule a follow-up appointment in: Spring    Thank you for choosing [facility] at [facility]!!    Kerby Moors, LPN

## 2016-11-11 ENCOUNTER — Other Ambulatory Visit: Payer: Self-pay | Admitting: Cardiology

## 2016-11-12 NOTE — Telephone Encounter (Signed)
Rx(s) sent to pharmacy electronically.  

## 2016-11-18 ENCOUNTER — Ambulatory Visit (HOSPITAL_COMMUNITY)
Admission: EM | Admit: 2016-11-18 | Discharge: 2016-11-18 | Disposition: A | Discharge status: Home / Self Care | Payer: Medicare Other | Attending: Family Medicine | Admitting: Family Medicine

## 2016-11-18 ENCOUNTER — Encounter (HOSPITAL_COMMUNITY): Payer: Self-pay | Admitting: Emergency Medicine

## 2016-11-18 DIAGNOSIS — K625 Hemorrhage of anus and rectum: Secondary | ICD-10-CM | POA: Insufficient documentation

## 2016-11-18 NOTE — Discharge Instructions (Signed)
You will need to be seen by gastroenterology in the next day or so. If bleeding becomes heavy, go directly to the emergency room.

## 2016-11-18 NOTE — ED Provider Notes (Signed)
[place]    CSN: [id number] Arrival date & time: 11/18/16  1207     History   Chief Complaint Chief Complaint  Patient presents with  . Rectal Bleeding    HPI [name] [name] is a 76 y.o. female.   Patient presents to [facility] she states that she is bleeding from her rectum, she states that she went to the bathroom yesterday morning, she states that she saw blood clots in her feces. She states it does have an odor to it. No fever, Nausea or Vomiting.   Patient is on Eliquis anticoagulation for atrial fibrillation since last summer.  The blood is definitely from her rectum.  She's noticed it only yesterday and today with BM  No weakness.      Past Medical History:  Diagnosis Date  . Anemia   . Arthritis   . Asthma   . Atrial fibrillation ([place])    a. 11/2006 Echo: EF 50-55%, mild MR;  b. On Pradaxa & rate control  . Breast cancer ([place])    with lymph node resection (16 nodes)  . Diverticulosis   . Dyslipidemia   . Gallstones   . Hemorrhoids   . Hyperplastic colon polyp 2005  . Hypothyroidism   . Obesity   . Osteoarthritis    a. knees/hips    Patient Active Problem List   Diagnosis Date Noted  . Situational depression 10/25/2016  . Personal history of colonic polyps 05/13/2014  . Chronic anticoagulation 05/13/2014  . SHORTNESS OF BREATH 07/14/2010  . Morbid obesity with BMI of 40.0-44.9, adult ([place]) 07/15/2009  . HYPOTHYROIDISM 07/18/2008  . ESSENTIAL HYPERTENSION, BENIGN 07/18/2008  . Permanent atrial fibrillation ([place]) 07/18/2008  . ASTHMA, UNSPECIFIED, UNSPECIFIED STATUS 07/18/2008  . Personal history of malignant neoplasm of breast 07/18/2008    Past Surgical History:  Procedure Laterality Date  . BREAST LUMPECTOMY     left  . LYMPH NODE DISSECTION     due to breast cancer  . mole excision      OB History    No data available       Home Medications    Prior to Admission medications   Medication Sig Start Date End Date  Taking? Authorizing Provider  chlorpheniramine-HYDROcodone ([place]) 10-8 MG/5ML [facility]  11/06/12   Historical Provider, MD  Cholecalciferol (VITAMIN D3) 5000 units CAPS Take 1 capsule by mouth daily.    Historical Provider, MD  ELIQUIS 5 MG TABS tablet TAKE 1 TABLET BY MOUTH TWICE A DAY 10/15/16   Minus Breeding, MD  meclizine (ANTIVERT) 25 MG tablet Take 1 tablet (25 mg total) by mouth 3 (three) times daily as needed for dizziness. 12/15/13   Lutricia Feil, PA  metoprolol succinate (TOPROL-XL) 50 MG 24 hr tablet TAKE 1 TABLET BY MOUTH DAILY 11/12/16   Minus Breeding, MD  Multiple Vitamins-Minerals (CENTRUM SILVER ADULT 50+ PO) Take by mouth.    Historical Provider, MD  Vitamin D, Ergocalciferol, (DRISDOL) 50000 units CAPS capsule Take 50,000 Units by mouth every 7 (seven) days.    Historical Provider, MD    Family History Family History  Problem Relation Age of Onset  . Heart disease Father   . Hypertension Father   . Alzheimer's disease Mother   . Breast cancer Mother   . Stroke Brother   . Hypertension Brother   . Prostate cancer Maternal Grandfather   . Colon cancer Maternal Aunt     Social History Social History  Substance Use Topics  .  Smoking status: Never Smoker  . Smokeless tobacco: Never Used  . Alcohol use No     Allergies   Cortisone and Statins   Review of Systems Review of Systems  Constitutional: Negative.   HENT: Negative.   Respiratory: Negative.   Cardiovascular: Negative.   Gastrointestinal: Positive for anal bleeding. Negative for abdominal pain, diarrhea and rectal pain.     Physical Exam Triage Vital Signs ED Triage Vitals  Enc Vitals Group     BP 11/18/16 1242 140/67     Pulse Rate 11/18/16 1242 92     Resp 11/18/16 1242 16     Temp 11/18/16 1242 97.6 F (36.4 C)     Temp Source 11/18/16 1242 Oral     SpO2 11/18/16 1242 99 %     Weight --      Height --      Head Circumference --      Peak Flow --      Pain Score 11/18/16 1241  0     Pain Loc --      Pain Edu? --      Excl. in [place]? --    No data found.   Updated Vital Signs BP 140/67 (BP Location: Right Arm)   Pulse 92   Temp 97.6 F (36.4 C) (Oral)   Resp 16   SpO2 99%    Physical Exam  Constitutional: She is oriented to person, place, and time. She appears well-developed and well-nourished.  HENT:  Head: Normocephalic.  Right Ear: External ear normal.  Left Ear: External ear normal.  Mouth/Throat: Oropharynx is clear and moist.  Eyes: Conjunctivae are normal. Pupils are equal, round, and reactive to light.  Neck: Normal range of motion. Neck supple.  Cardiovascular: Normal rate.   Pulmonary/Chest: Effort normal and breath sounds normal.  Abdominal: Soft. Bowel sounds are normal. She exhibits no distension. There is no tenderness.  No hemorrhoids  Musculoskeletal: Normal range of motion.  Neurological: She is alert and oriented to person, place, and time.  Skin: Skin is warm and dry.  Nursing note and vitals reviewed.    UC Treatments / Results  Labs (all labs ordered are listed, but only abnormal results are displayed) Labs Reviewed - No data to display  EKG  EKG Interpretation None       Radiology No results found.  Procedures Procedures (including critical care time)  Medications Ordered in UC Medications - No data to display   Initial Impression / Assessment and Plan / UC Course  I have reviewed the triage vital signs and the nursing notes.  Pertinent labs & imaging results that were available during my care of the patient were reviewed by me and considered in my medical decision making (see chart for details).     Final Clinical Impressions(s) / UC Diagnoses   Final diagnoses:  Rectal bleeding    New Prescriptions Current Discharge Medication List    Patient is instructed to call her gastroenterologist tomorrow. If she has heavy bleeding in the meantime, she is to go directly to the emergency department.     Robyn Haber, MD 11/18/16 1300

## 2016-11-18 NOTE — ED Triage Notes (Signed)
Patient presents to [facility] she states that she is bleeding from her rectum, she states that she went to the bathroom yesterday morning, she states that she saw blood clots in her feces. She states it does have an odor to it. No fever, Nausea or Vomiting.

## 2016-11-20 ENCOUNTER — Ambulatory Visit (INDEPENDENT_AMBULATORY_CARE_PROVIDER_SITE_OTHER): Payer: Medicare Other | Admitting: Gastroenterology

## 2016-11-20 VITALS — BP 140/84 | HR 92 | Ht 66.0 in | Wt 257.0 lb

## 2016-11-20 DIAGNOSIS — D62 Acute posthemorrhagic anemia: Secondary | ICD-10-CM | POA: Diagnosis not present

## 2016-11-20 DIAGNOSIS — Z6841 Body Mass Index (BMI) 40.0 and over, adult: Secondary | ICD-10-CM

## 2016-11-20 DIAGNOSIS — I482 Chronic atrial fibrillation: Secondary | ICD-10-CM

## 2016-11-20 DIAGNOSIS — Z7901 Long term (current) use of anticoagulants: Secondary | ICD-10-CM

## 2016-11-20 DIAGNOSIS — K921 Melena: Secondary | ICD-10-CM | POA: Diagnosis not present

## 2016-11-20 DIAGNOSIS — I4821 Permanent atrial fibrillation: Secondary | ICD-10-CM

## 2016-11-20 DIAGNOSIS — Z86010 Personal history of colonic polyps: Secondary | ICD-10-CM

## 2016-11-20 NOTE — Patient Instructions (Signed)
If you are age 76 or older, your body mass index should be between 23-30. Your Body mass index is 41.48 kg/m. If this is out of the aforementioned range listed, please consider follow up with your Primary Care Provider.  If you are age 81 or younger, your body mass index should be between 19-25. Your Body mass index is 41.48 kg/m. If this is out of the aformentioned range listed, please consider follow up with your Primary Care Provider.   Please come to our lab and have blood drawn Thursday 11/22/16.  No appointment needed, the lab is open 7:30AM- 5:30PM  Thank you for choosing [facility]  Dr Wilfrid Lund III

## 2016-11-20 NOTE — Progress Notes (Signed)
[facility] Progress Note  Chief Complaint: Hematochezia  Subjective  History:  This is a 76 year old woman seen as a same-day visit after call from her PCP today. She was last seen by Dr. Olevia Perches for a colonoscopy in September 2015, at which time 3 polyps (2 hyperplastic, one adenomatous) and pan diverticulosis were discovered. 3 days ago she awoke in the early a.m. with bright red blood per rectum and some small clots in the toilet bowl. She had another episode of that later in the day but continue to take her [place] twice a day as usual. The following day she had a smaller volume of blood that she describes as darker. She went to the [facility] urgent care. I read that note, she was evaluated including a rectal exam, but no labs were done. She was sent home with instructions to see her GI doctor the following day. She went to primary care today where she described having just had a small passage of black stool last evening and none today. She took her [place] once the day before yesterday and once yesterday but none today. She denies abdominal pain throughout this episode, and says she has never had previous GI bleeding. She denies hematemesis with this episode. Aurella also denies aspirin or NSAID use. Hemoglobin was 11.7 at primary care earlier today, last hemoglobin available in the Epic system was 13.1 on 08/23/2016  She reports feeling well right now, denies lightheadedness, chest pain or dyspnea. She drove herself to and from her primary doctor office today and to our visit today. Her vital signs are normal as recorded below.  ROS: Cardiovascular:  no chest pain Respiratory: no dyspnea  Past Medical History:  Diagnosis Date  . Anemia   . Arthritis   . Asthma   . Atrial fibrillation ([place])    a. 11/2006 Echo: EF 50-55%, mild MR;  b. On Pradaxa & rate control  . Breast cancer ([place])    with lymph node resection (16 nodes)  . Diverticulosis   . Dyslipidemia   . Gallstones   . Hemorrhoids   .  Hyperplastic colon polyp 2005  . Hypothyroidism   . Obesity   . Osteoarthritis    a. knees/hips    Current Outpatient Prescriptions:  .  chlorpheniramine-HYDROcodone (TUSSIONEX) 10-8 MG/5ML LQCR, as needed. , Disp: , Rfl:  .  Cholecalciferol (VITAMIN D3) 5000 units CAPS, Take 1 capsule by mouth daily., Disp: , Rfl:  .  ELIQUIS 5 MG TABS tablet, TAKE 1 TABLET BY MOUTH TWICE A DAY, Disp: 60 tablet, Rfl: 5 .  meclizine (ANTIVERT) 25 MG tablet, Take 1 tablet (25 mg total) by mouth 3 (three) times daily as needed for dizziness., Disp: 30 tablet, Rfl: 0 .  meclizine (ANTIVERT) 25 MG tablet, Take 25 mg by mouth. Takes for dizzy flares as follows:  Take 2 tablets , wait 1 hour and take one more tablet, Disp: , Rfl:  .  metoprolol succinate (TOPROL-XL) 50 MG 24 hr tablet, TAKE 1 TABLET BY MOUTH DAILY, Disp: 90 tablet, Rfl: 3 .  Multiple Vitamins-Minerals (CENTRUM SILVER ADULT 50+ PO), Take by mouth., Disp: , Rfl:   The patient's Past Medical, Family and Social History were reviewed and are on file in the EMR.  Objective:  Med list reviewed  Vital signs in last 24 hrs: Vitals:   11/20/16 1435  BP: 140/84  Pulse: 92    Physical Exam  Well-appearing older woman, alert and conversational and in no distress.  HEENT: sclera  anicteric, oral mucosa moist without lesions  Neck: supple, no thyromegaly, JVD or lymphadenopathy  Cardiac: Irregular rhythm without murmurs, S1S2 heard, no peripheral edema  Pulm: clear to auscultation bilaterally, normal RR and effort noted  Abdomen: Obese, soft, no tenderness, with active bowel sounds. No guarding or palpable hepatosplenomegaly.  Skin; warm and dry, no jaundice or rash Rectal exam deferred as done earlier today by primary care as noted above  Recent Labs:  Hemoglobin 11.7 earlier today Platelets 230   Prior colonoscopy report from 2015 as noted above   @ASSESSMENTPLANBEGIN @ Assessment: Encounter Diagnoses  Name Primary?  .  Hematochezia Yes  . Acute blood loss anemia   . Permanent atrial fibrillation ([place])   . Morbid obesity with BMI of 40.0-44.9, adult ([place])   . History of colonic polyps   . Chronic anticoagulation     I think she was having a diverticular bleed that now seems to have stopped. As such, I think the yield of a colonoscopy at this point is likely to be low. She understands that if she has another episode of bright red blood that is anything more than just a few drops or a smear on the toilet paper, she should proceed immediately to the emergency department. I will keep her off [place] at least until early next week we call her for an update.  She will go to the lab the morning after next for a repeat CBC to ensure stability of her hemoglobin.  If bleeding does not recur by early next week, I will most likely tell her to resume her [place].  Total time 45 minutes, over half spent in counseling and coordination of care.   [name] [name]

## 2016-11-22 ENCOUNTER — Other Ambulatory Visit (INDEPENDENT_AMBULATORY_CARE_PROVIDER_SITE_OTHER): Payer: Medicare Other

## 2016-11-22 DIAGNOSIS — I4821 Permanent atrial fibrillation: Secondary | ICD-10-CM

## 2016-11-22 DIAGNOSIS — Z6841 Body Mass Index (BMI) 40.0 and over, adult: Secondary | ICD-10-CM | POA: Diagnosis not present

## 2016-11-22 DIAGNOSIS — I482 Chronic atrial fibrillation: Secondary | ICD-10-CM

## 2016-11-22 DIAGNOSIS — Z7901 Long term (current) use of anticoagulants: Secondary | ICD-10-CM

## 2016-11-22 DIAGNOSIS — Z86010 Personal history of colonic polyps: Secondary | ICD-10-CM | POA: Diagnosis not present

## 2016-11-22 DIAGNOSIS — K921 Melena: Secondary | ICD-10-CM | POA: Diagnosis not present

## 2016-11-22 DIAGNOSIS — D62 Acute posthemorrhagic anemia: Secondary | ICD-10-CM

## 2016-11-22 LAB — CBC WITH DIFFERENTIAL/PLATELET
BASOS ABS: 0.1 10*3/uL (ref 0.0–0.1)
Basophils Relative: 0.9 % (ref 0.0–3.0)
EOS PCT: 2.9 % (ref 0.0–5.0)
Eosinophils Absolute: 0.2 10*3/uL (ref 0.0–0.7)
HEMATOCRIT: 34.9 % — AB (ref 36.0–46.0)
Hemoglobin: 11.5 g/dL — ABNORMAL LOW (ref 12.0–15.0)
LYMPHS ABS: 1.8 10*3/uL (ref 0.7–4.0)
LYMPHS PCT: 29.3 % (ref 12.0–46.0)
MCHC: 32.8 g/dL (ref 30.0–36.0)
MCV: 80.7 fl (ref 78.0–100.0)
MONOS PCT: 10.4 % (ref 3.0–12.0)
Monocytes Absolute: 0.7 10*3/uL (ref 0.1–1.0)
NEUTROS ABS: 3.5 10*3/uL (ref 1.4–7.7)
Neutrophils Relative %: 56.5 % (ref 43.0–77.0)
Platelets: 233 10*3/uL (ref 150.0–400.0)
RBC: 4.33 Mil/uL (ref 3.87–5.11)
RDW: 16.1 % — ABNORMAL HIGH (ref 11.5–15.5)
WBC: 6.3 10*3/uL (ref 4.0–10.5)

## 2016-12-04 DIAGNOSIS — I482 Chronic atrial fibrillation: Secondary | ICD-10-CM | POA: Diagnosis not present

## 2016-12-04 DIAGNOSIS — E782 Mixed hyperlipidemia: Secondary | ICD-10-CM | POA: Diagnosis not present

## 2016-12-04 DIAGNOSIS — K625 Hemorrhage of anus and rectum: Secondary | ICD-10-CM | POA: Diagnosis not present

## 2016-12-04 DIAGNOSIS — R7301 Impaired fasting glucose: Secondary | ICD-10-CM | POA: Diagnosis not present

## 2017-01-16 NOTE — Progress Notes (Signed)
HPI The patient presents for followup of atrial fibrillation.  .Since I last saw her she has done well from a cardiac standpoint.  she did have diverticulitis and had some bleeding. She came off of her Eliquis for a short period but is back on this.  She is still mourning the death of her husband.   is getting out doing a little bit more. She is going to go back to [employer]. She has gained some of the weight that she had lost.     The patient denies any new symptoms such as chest discomfort, neck or arm discomfort. There has been no new shortness of breath, PND or orthopnea. There have been no reported palpitations, presyncope or syncope.  She goes up and down stairs.     Allergies  Allergen Reactions  . Cortisone Other (See Comments)    REACTION: weight gain  . Statins Other (See Comments)    Myalgias     Current Outpatient Prescriptions  Medication Sig Dispense Refill  . chlorpheniramine-HYDROcodone (TUSSIONEX) 10-8 MG/5ML LQCR as needed.     . Cholecalciferol (VITAMIN D3) 5000 units CAPS Take 1 capsule by mouth daily.    Marland Kitchen ELIQUIS 5 MG TABS tablet TAKE 1 TABLET BY MOUTH TWICE A DAY 60 tablet 5  . meclizine (ANTIVERT) 25 MG tablet Take 25 mg by mouth. Takes for dizzy flares as follows:  Take 2 tablets , wait 1 hour and take one more tablet    . metoprolol succinate (TOPROL-XL) 50 MG 24 hr tablet Take 1 1/2 tablets daily.Take with or immediately following a meal. 135 tablet 3  . Multiple Vitamins-Minerals (CENTRUM SILVER ADULT 50+ PO) Take by mouth.     No current facility-administered medications for this visit.     Past Medical History:  Diagnosis Date  . Anemia   . Arthritis   . Asthma   . Atrial fibrillation ([place])    a. 11/2006 Echo: EF 50-55%, mild MR;  b. On Pradaxa & rate control  . Breast cancer ([place])    with lymph node resection (16 nodes)  . Diverticulosis   . Dyslipidemia   . Gallstones   . Hemorrhoids   . Hyperplastic colon polyp 2005  . Hypothyroidism    . Obesity   . Osteoarthritis    a. knees/hips    Past Surgical History:  Procedure Laterality Date  . BREAST LUMPECTOMY     left  . LYMPH NODE DISSECTION     due to breast cancer  . mole excision     ROS:     As stated in the HPI and negative for all other systems.  PHYSICAL EXAM BP (!) 160/98   Pulse 96   Ht 5\' 6"  (1.676 m)   Wt 263 lb 6.4 oz (119.5 kg)   BMI 42.51 kg/m  GENERAL:  Well appearing, and in no distress.  Sad NECK:  No jugular venous distention, waveform within normal limits, carotid upstroke brisk and symmetric, no bruits, no thyromegaly LUNGS:  Clear to auscultation bilaterally HEART:  PMI not displaced or sustained,S1 and S2 within normal limits, no C5,YI clicks, no rubs, no murmurs, irregular ABD:  Flat, positive bowel sounds normal in frequency in pitch, no bruits, no rebound, no guarding, no midline pulsatile mass, no hepatomegaly, no splenomegaly EXT:  2 plus pulses throughout, no edema, no cyanosis no clubbing  EKG:  NA  Lab Results  Component Value Date   CHOL 157 08/23/2016   TRIG 47 08/23/2016  HDL 50 (L) 08/23/2016   LDLCALC 98 08/23/2016     ASSESSMENT AND PLAN  Atrial fibrillation:   [name] [name] has a CHA2DS2 - VASc score of 4 with a risk of stroke of 4%.   The patient  tolerates this rhythm and rate control and anticoagulation. We will continue with the meds as listed.  HTN:  Her blood pressure is  not at target.   I will increase her Toprol XL ot 75 mg daily.   HL: She has recently been tried on multiple statins. She's intolerant of them all.  No change in therapy is indicated.  I will check a lipid profile.   MORBID OBESITY:   We talked about specific strategies today. She is going to join [employer]. She still way down from her peak and I applaud this.  We'll

## 2017-01-17 ENCOUNTER — Ambulatory Visit (INDEPENDENT_AMBULATORY_CARE_PROVIDER_SITE_OTHER): Payer: Medicare Other | Admitting: Cardiology

## 2017-01-17 ENCOUNTER — Encounter: Payer: Self-pay | Admitting: Cardiology

## 2017-01-17 VITALS — BP 160/98 | HR 96 | Ht 66.0 in | Wt 263.4 lb

## 2017-01-17 DIAGNOSIS — E785 Hyperlipidemia, unspecified: Secondary | ICD-10-CM

## 2017-01-17 DIAGNOSIS — I482 Chronic atrial fibrillation, unspecified: Secondary | ICD-10-CM

## 2017-01-17 DIAGNOSIS — I10 Essential (primary) hypertension: Secondary | ICD-10-CM

## 2017-01-17 MED ORDER — METOPROLOL SUCCINATE ER 50 MG PO TB24: ORAL_TABLET | ORAL | 3 refills | Status: DC

## 2017-01-17 NOTE — Patient Instructions (Addendum)
Medication Instructions:  INCREASE- Metoprolol 1 1/2 tablets daily  Labwork: None Ordered  Testing/Procedures: None ordered  Follow-Up: Your physician wants you to follow-up in: 6 Months. You will receive a reminder letter in the mail two months in advance. If you don't receive a letter, please call our office to schedule the follow-up appointment.   Any Other Special Instructions Will Be Listed Below (If Applicable).   If you need a refill on your cardiac medications before your next appointment, please call your pharmacy.

## 2017-01-22 DIAGNOSIS — H348322 Tributary (branch) retinal vein occlusion, left eye, stable: Secondary | ICD-10-CM | POA: Diagnosis not present

## 2017-01-22 DIAGNOSIS — Z961 Presence of intraocular lens: Secondary | ICD-10-CM | POA: Diagnosis not present

## 2017-01-22 DIAGNOSIS — H35033 Hypertensive retinopathy, bilateral: Secondary | ICD-10-CM | POA: Diagnosis not present

## 2017-01-22 DIAGNOSIS — H26493 Other secondary cataract, bilateral: Secondary | ICD-10-CM | POA: Diagnosis not present

## 2017-02-05 DIAGNOSIS — E782 Mixed hyperlipidemia: Secondary | ICD-10-CM | POA: Diagnosis not present

## 2017-02-05 DIAGNOSIS — Z0000 Encounter for general adult medical examination without abnormal findings: Secondary | ICD-10-CM | POA: Diagnosis not present

## 2017-02-05 DIAGNOSIS — I482 Chronic atrial fibrillation: Secondary | ICD-10-CM | POA: Diagnosis not present

## 2017-02-05 DIAGNOSIS — D500 Iron deficiency anemia secondary to blood loss (chronic): Secondary | ICD-10-CM | POA: Diagnosis not present

## 2017-02-05 DIAGNOSIS — R7301 Impaired fasting glucose: Secondary | ICD-10-CM | POA: Diagnosis not present

## 2017-02-05 DIAGNOSIS — I10 Essential (primary) hypertension: Secondary | ICD-10-CM | POA: Diagnosis not present

## 2017-02-12 DIAGNOSIS — D500 Iron deficiency anemia secondary to blood loss (chronic): Secondary | ICD-10-CM | POA: Diagnosis not present

## 2017-02-12 DIAGNOSIS — Z23 Encounter for immunization: Secondary | ICD-10-CM | POA: Diagnosis not present

## 2017-02-12 DIAGNOSIS — I482 Chronic atrial fibrillation: Secondary | ICD-10-CM | POA: Diagnosis not present

## 2017-02-12 DIAGNOSIS — E782 Mixed hyperlipidemia: Secondary | ICD-10-CM | POA: Diagnosis not present

## 2017-02-12 DIAGNOSIS — R7301 Impaired fasting glucose: Secondary | ICD-10-CM | POA: Diagnosis not present

## 2017-04-18 ENCOUNTER — Other Ambulatory Visit: Payer: Self-pay | Admitting: Cardiology

## 2017-04-30 DIAGNOSIS — D500 Iron deficiency anemia secondary to blood loss (chronic): Secondary | ICD-10-CM | POA: Diagnosis not present

## 2017-04-30 DIAGNOSIS — E782 Mixed hyperlipidemia: Secondary | ICD-10-CM | POA: Diagnosis not present

## 2017-05-07 DIAGNOSIS — I482 Chronic atrial fibrillation: Secondary | ICD-10-CM | POA: Diagnosis not present

## 2017-05-07 DIAGNOSIS — E782 Mixed hyperlipidemia: Secondary | ICD-10-CM | POA: Diagnosis not present

## 2017-05-07 DIAGNOSIS — R7301 Impaired fasting glucose: Secondary | ICD-10-CM | POA: Diagnosis not present

## 2017-05-07 DIAGNOSIS — D500 Iron deficiency anemia secondary to blood loss (chronic): Secondary | ICD-10-CM | POA: Diagnosis not present

## 2017-08-20 DIAGNOSIS — R7301 Impaired fasting glucose: Secondary | ICD-10-CM | POA: Diagnosis not present

## 2017-08-20 DIAGNOSIS — E782 Mixed hyperlipidemia: Secondary | ICD-10-CM | POA: Diagnosis not present

## 2017-08-20 DIAGNOSIS — D500 Iron deficiency anemia secondary to blood loss (chronic): Secondary | ICD-10-CM | POA: Diagnosis not present

## 2017-09-02 DIAGNOSIS — I10 Essential (primary) hypertension: Secondary | ICD-10-CM | POA: Diagnosis not present

## 2017-09-02 DIAGNOSIS — E782 Mixed hyperlipidemia: Secondary | ICD-10-CM | POA: Diagnosis not present

## 2017-09-02 DIAGNOSIS — R7301 Impaired fasting glucose: Secondary | ICD-10-CM | POA: Diagnosis not present

## 2017-09-02 DIAGNOSIS — I482 Chronic atrial fibrillation: Secondary | ICD-10-CM | POA: Diagnosis not present

## 2017-10-01 DIAGNOSIS — Z1231 Encounter for screening mammogram for malignant neoplasm of breast: Secondary | ICD-10-CM | POA: Diagnosis not present

## 2017-10-01 DIAGNOSIS — Z853 Personal history of malignant neoplasm of breast: Secondary | ICD-10-CM | POA: Diagnosis not present

## 2017-10-21 ENCOUNTER — Other Ambulatory Visit: Payer: Self-pay | Admitting: Cardiology

## 2017-10-31 DIAGNOSIS — R683 Clubbing of fingers: Secondary | ICD-10-CM | POA: Diagnosis not present

## 2017-10-31 DIAGNOSIS — R05 Cough: Secondary | ICD-10-CM | POA: Diagnosis not present

## 2017-10-31 DIAGNOSIS — J029 Acute pharyngitis, unspecified: Secondary | ICD-10-CM | POA: Diagnosis not present

## 2017-10-31 DIAGNOSIS — J111 Influenza due to unidentified influenza virus with other respiratory manifestations: Secondary | ICD-10-CM | POA: Diagnosis not present

## 2017-10-31 DIAGNOSIS — M150 Primary generalized (osteo)arthritis: Secondary | ICD-10-CM | POA: Diagnosis not present

## 2017-10-31 DIAGNOSIS — E782 Mixed hyperlipidemia: Secondary | ICD-10-CM | POA: Diagnosis not present

## 2017-10-31 DIAGNOSIS — R7301 Impaired fasting glucose: Secondary | ICD-10-CM | POA: Diagnosis not present

## 2017-10-31 DIAGNOSIS — C7490 Malignant neoplasm of unspecified part of unspecified adrenal gland: Secondary | ICD-10-CM | POA: Diagnosis not present

## 2017-10-31 DIAGNOSIS — I10 Essential (primary) hypertension: Secondary | ICD-10-CM | POA: Diagnosis not present

## 2017-10-31 DIAGNOSIS — I482 Chronic atrial fibrillation: Secondary | ICD-10-CM | POA: Diagnosis not present

## 2017-11-19 DIAGNOSIS — D573 Sickle-cell trait: Secondary | ICD-10-CM | POA: Diagnosis not present

## 2017-11-19 DIAGNOSIS — K219 Gastro-esophageal reflux disease without esophagitis: Secondary | ICD-10-CM | POA: Diagnosis not present

## 2017-11-19 DIAGNOSIS — E782 Mixed hyperlipidemia: Secondary | ICD-10-CM | POA: Diagnosis not present

## 2017-11-19 DIAGNOSIS — H8110 Benign paroxysmal vertigo, unspecified ear: Secondary | ICD-10-CM | POA: Diagnosis not present

## 2017-11-19 DIAGNOSIS — I10 Essential (primary) hypertension: Secondary | ICD-10-CM | POA: Diagnosis not present

## 2018-01-02 DIAGNOSIS — M25561 Pain in right knee: Secondary | ICD-10-CM | POA: Diagnosis not present

## 2018-01-28 DIAGNOSIS — H029 Unspecified disorder of eyelid: Secondary | ICD-10-CM | POA: Diagnosis not present

## 2018-01-28 DIAGNOSIS — H348322 Tributary (branch) retinal vein occlusion, left eye, stable: Secondary | ICD-10-CM | POA: Diagnosis not present

## 2018-01-28 DIAGNOSIS — H26493 Other secondary cataract, bilateral: Secondary | ICD-10-CM | POA: Diagnosis not present

## 2018-01-28 DIAGNOSIS — Z961 Presence of intraocular lens: Secondary | ICD-10-CM | POA: Diagnosis not present

## 2018-02-04 ENCOUNTER — Other Ambulatory Visit: Payer: Self-pay | Admitting: Cardiology

## 2018-02-04 NOTE — Telephone Encounter (Signed)
REFILL 

## 2018-02-17 DIAGNOSIS — R7301 Impaired fasting glucose: Secondary | ICD-10-CM | POA: Diagnosis not present

## 2018-02-17 DIAGNOSIS — Z0000 Encounter for general adult medical examination without abnormal findings: Secondary | ICD-10-CM | POA: Diagnosis not present

## 2018-02-17 DIAGNOSIS — I482 Chronic atrial fibrillation: Secondary | ICD-10-CM | POA: Diagnosis not present

## 2018-02-17 DIAGNOSIS — E782 Mixed hyperlipidemia: Secondary | ICD-10-CM | POA: Diagnosis not present

## 2018-02-17 DIAGNOSIS — I10 Essential (primary) hypertension: Secondary | ICD-10-CM | POA: Diagnosis not present

## 2018-02-17 DIAGNOSIS — M109 Gout, unspecified: Secondary | ICD-10-CM | POA: Diagnosis not present

## 2018-02-24 DIAGNOSIS — I482 Chronic atrial fibrillation: Secondary | ICD-10-CM | POA: Diagnosis not present

## 2018-02-24 DIAGNOSIS — D573 Sickle-cell trait: Secondary | ICD-10-CM | POA: Diagnosis not present

## 2018-02-24 DIAGNOSIS — E782 Mixed hyperlipidemia: Secondary | ICD-10-CM | POA: Diagnosis not present

## 2018-02-24 DIAGNOSIS — Z23 Encounter for immunization: Secondary | ICD-10-CM | POA: Diagnosis not present

## 2018-02-24 DIAGNOSIS — M150 Primary generalized (osteo)arthritis: Secondary | ICD-10-CM | POA: Diagnosis not present

## 2018-02-24 DIAGNOSIS — R7301 Impaired fasting glucose: Secondary | ICD-10-CM | POA: Diagnosis not present

## 2018-02-24 DIAGNOSIS — I10 Essential (primary) hypertension: Secondary | ICD-10-CM | POA: Diagnosis not present

## 2018-05-13 ENCOUNTER — Other Ambulatory Visit: Payer: Self-pay | Admitting: Cardiology

## 2018-05-15 ENCOUNTER — Other Ambulatory Visit: Payer: Self-pay | Admitting: Cardiology

## 2018-05-15 NOTE — Telephone Encounter (Signed)
Rx sent to pharmacy   

## 2018-07-02 DIAGNOSIS — M109 Gout, unspecified: Secondary | ICD-10-CM | POA: Diagnosis not present

## 2018-07-02 DIAGNOSIS — M150 Primary generalized (osteo)arthritis: Secondary | ICD-10-CM | POA: Diagnosis not present

## 2018-07-02 DIAGNOSIS — I10 Essential (primary) hypertension: Secondary | ICD-10-CM | POA: Diagnosis not present

## 2018-07-15 DIAGNOSIS — M150 Primary generalized (osteo)arthritis: Secondary | ICD-10-CM | POA: Diagnosis not present

## 2018-07-15 DIAGNOSIS — M1712 Unilateral primary osteoarthritis, left knee: Secondary | ICD-10-CM | POA: Diagnosis not present

## 2018-07-15 DIAGNOSIS — I10 Essential (primary) hypertension: Secondary | ICD-10-CM | POA: Diagnosis not present

## 2018-07-15 DIAGNOSIS — M25562 Pain in left knee: Secondary | ICD-10-CM | POA: Diagnosis not present

## 2018-07-24 ENCOUNTER — Other Ambulatory Visit: Payer: Self-pay | Admitting: Cardiology

## 2018-08-15 NOTE — Progress Notes (Signed)
HPI The patient presents for followup of atrial fibrillation.  She returns for follow-up.  She is been feeling well from a cardiovascular standpoint.  She tolerates her anticoagulation.  She does not feel her atrial fibrillation.  She has been bothered by some shoulder pain and then some leg pain and has been walking with a cane.  She says she was at a funeral and had cold air blowing on her and that is where she developed some joint problems.  She denies any other cardiovascular symptoms. The patient denies any new symptoms such as chest discomfort, neck or arm discomfort. There has been no new shortness of breath, PND or orthopnea. There have been no reported palpitations, presyncope or syncope.    Allergies  Allergen Reactions  . Cortisone Other (See Comments)    REACTION: weight gain  . Statins Other (See Comments)    Myalgias     Current Outpatient Medications  Medication Sig Dispense Refill  . apixaban (ELIQUIS) 5 MG TABS tablet Take 1 tablet (5 mg total) by mouth 2 (two) times daily. PLEASE SCHEDULE F/U VISIT PRIOR TO NEXT REFILL AUTHORIZATION 180 tablet 1  . Cholecalciferol (VITAMIN D3) 5000 units CAPS Take 1 capsule by mouth daily.    . Fluticasone Propionate (FLONASE NA) Flonase    . meclizine (ANTIVERT) 25 MG tablet Take 25 mg by mouth. Takes for dizzy flares as follows:  Take 2 tablets , wait 1 hour and take one more tablet    . metoprolol succinate (TOPROL-XL) 50 MG 24 hr tablet Take 1 tablet (50 mg total) by mouth 2 (two) times daily. Take with or immediately following a meal. 60 tablet 11  . Multiple Vitamins-Minerals (CENTRUM SILVER ADULT 50+ PO) Take by mouth.     No current facility-administered medications for this visit.     Past Medical History:  Diagnosis Date  . Anemia   . Arthritis   . Asthma   . Atrial fibrillation ([place])    a. 11/2006 Echo: EF 50-55%, mild MR;  b. On Pradaxa & rate control  . Breast cancer ([place])    with lymph node resection (16 nodes)    . Diverticulosis   . Dyslipidemia   . Gallstones   . Hemorrhoids   . Hyperplastic colon polyp 2005  . Hypothyroidism   . Obesity   . Osteoarthritis    a. knees/hips    Past Surgical History:  Procedure Laterality Date  . BREAST LUMPECTOMY     left  . LYMPH NODE DISSECTION     due to breast cancer  . mole excision     ROS:    As stated in the HPI and negative for all other systems.  PHYSICAL EXAM BP (!) 171/94   Pulse (!) 107   Ht 5\' 7"  (1.702 m)   Wt 270 lb (122.5 kg)   BMI 42.29 kg/m  GENERAL:  Well appearing NECK:  No jugular venous distention, waveform within normal limits, carotid upstroke brisk and symmetric, no bruits, no thyromegaly LUNGS:  Clear to auscultation bilaterally CHEST:  Unremarkable HEART:  PMI not displaced or sustained,S1 and S2 within normal limits, no S3, no clicks, no rubs, no murmurs, irregular ABD:  Flat, positive bowel sounds normal in frequency in pitch, no bruits, no rebound, no guarding, no midline pulsatile mass, no hepatomegaly, no splenomegaly EXT:  2 plus pulses throughout, mild bilateral lower extremity edema, no cyanosis no clubbing, chronic varicosities    EKG: Atrial fibrillation, rate 107, axis within  normal limits, intervals within normal limits, no acute ST-T wave changes.  Lab Results  Component Value Date   CHOL 157 08/23/2016   TRIG 47 08/23/2016   HDL 50 (L) 08/23/2016   LDLCALC 98 08/23/2016     ASSESSMENT AND PLAN  Atrial fibrillation:   [name] [name] has a CHA2DS2 - VASc score of 4 with a risk of stroke of 4%.    I may increase her metoprolol to 1 whole pill twice daily.  In 2 weeks she will need a Holter monitor to make sure she has good rate control.  She will continue with anticoagulation.  HTN:  Her blood pressure is not at target.  I will see how she does with the increase beta-blocker.  She needs to keep a home blood pressure diary.   HL: She has recently been tried on multiple statins.   She's intolerant of them all.  No change in therapy.   MORBID OBESITY:   We have talked specifically about strategies for this.  She understands the need to lose weight with diet and exercise.  She is going to start getting back to exercise now that some of her joint problems are improved.

## 2018-08-18 ENCOUNTER — Ambulatory Visit (INDEPENDENT_AMBULATORY_CARE_PROVIDER_SITE_OTHER): Payer: Medicare Other | Admitting: Cardiology

## 2018-08-18 ENCOUNTER — Encounter: Payer: Self-pay | Admitting: Cardiology

## 2018-08-18 ENCOUNTER — Encounter (INDEPENDENT_AMBULATORY_CARE_PROVIDER_SITE_OTHER): Payer: Self-pay

## 2018-08-18 VITALS — BP 171/94 | HR 107 | Ht 67.0 in | Wt 270.0 lb

## 2018-08-18 DIAGNOSIS — I4821 Permanent atrial fibrillation: Secondary | ICD-10-CM

## 2018-08-18 DIAGNOSIS — I10 Essential (primary) hypertension: Secondary | ICD-10-CM

## 2018-08-18 MED ORDER — METOPROLOL SUCCINATE ER 50 MG PO TB24: 50.0000 mg | ORAL_TABLET | Freq: Two times a day (BID) | ORAL | 11 refills | Status: DC

## 2018-08-18 NOTE — Patient Instructions (Signed)
Medication Instructions:  INCREASE- Metoprolol XL 50 mg twice a day  If you need a refill on your cardiac medications before your next appointment, please call your pharmacy.  Labwork: None Ordered   If you have labs (blood work) drawn today and your tests are completely normal, you will receive your results only by: Marland Kitchen MyChart Message (if you have MyChart) OR . A paper copy in the mail If you have any lab test that is abnormal or we need to change your treatment, we will call you to review the results.  Testing/Procedures: Your physician has recommended that you wear a 24 hour holter monitor in 2 weeks. Holter monitors are medical devices that record the heart's electrical activity. Doctors most often use these monitors to diagnose arrhythmias. Arrhythmias are problems with the speed or rhythm of the heartbeat. The monitor is a small, portable device. You can wear one while you do your normal daily activities. This is usually used to diagnose what is causing palpitations/syncope (passing out).   Follow-Up: . You will need a follow up appointment in 3 Months.    At [facility], you and your health needs are our priority.  As part of our continuing mission to provide you with exceptional heart care, we have created designated Provider Care Teams.  These Care Teams include your primary Cardiologist (physician) and Advanced Practice Providers (APPs -  Physician Assistants and Nurse Practitioners) who all work together to provide you with the care you need, when you need it.   Thank you for choosing [facility] at [facility]!!

## 2018-08-28 ENCOUNTER — Ambulatory Visit (INDEPENDENT_AMBULATORY_CARE_PROVIDER_SITE_OTHER): Payer: Medicare Other

## 2018-08-28 DIAGNOSIS — I4821 Permanent atrial fibrillation: Secondary | ICD-10-CM | POA: Diagnosis not present

## 2018-09-08 ENCOUNTER — Telehealth: Payer: Self-pay | Admitting: *Deleted

## 2018-09-08 NOTE — Telephone Encounter (Signed)
-----   Message from Minus Breeding, MD sent at 09/07/2018 11:05 AM EST ----- Rate is OK.  Atrial fib.  No change in therapy.  Call Ms. Chris Narasimhan with the results and send results to Merrilee Seashore, MD

## 2018-09-08 NOTE — Telephone Encounter (Signed)
The patient has been notified of the result and verbalized understanding.  All questions (if any) were answered. Raiford Simmonds, RN 09/08/2018 4:56 PM   PATIENT STATES SINCE SHE STARTED TAKING METOPROLOL SUCCINATE 50 MG TWICE A DAY- SHE FEELING TIRED AND DIZZY, sweating in the evening after the evening dose Patient aware will defer Dr Percival Spanish and contact her back  Routed to primary

## 2018-09-18 ENCOUNTER — Other Ambulatory Visit: Payer: Self-pay | Admitting: Cardiology

## 2018-09-22 DIAGNOSIS — R7301 Impaired fasting glucose: Secondary | ICD-10-CM | POA: Diagnosis not present

## 2018-09-22 DIAGNOSIS — I4821 Permanent atrial fibrillation: Secondary | ICD-10-CM | POA: Diagnosis not present

## 2018-09-22 DIAGNOSIS — I482 Chronic atrial fibrillation, unspecified: Secondary | ICD-10-CM | POA: Diagnosis not present

## 2018-09-22 DIAGNOSIS — E782 Mixed hyperlipidemia: Secondary | ICD-10-CM | POA: Diagnosis not present

## 2018-09-29 DIAGNOSIS — I482 Chronic atrial fibrillation, unspecified: Secondary | ICD-10-CM | POA: Diagnosis not present

## 2018-09-29 DIAGNOSIS — I10 Essential (primary) hypertension: Secondary | ICD-10-CM | POA: Diagnosis not present

## 2018-09-29 DIAGNOSIS — E782 Mixed hyperlipidemia: Secondary | ICD-10-CM | POA: Diagnosis not present

## 2018-09-29 DIAGNOSIS — R7301 Impaired fasting glucose: Secondary | ICD-10-CM | POA: Diagnosis not present

## 2018-10-07 DIAGNOSIS — Z1231 Encounter for screening mammogram for malignant neoplasm of breast: Secondary | ICD-10-CM | POA: Diagnosis not present

## 2018-10-07 DIAGNOSIS — Z853 Personal history of malignant neoplasm of breast: Secondary | ICD-10-CM | POA: Diagnosis not present

## 2018-11-05 ENCOUNTER — Encounter: Payer: Self-pay | Admitting: Cardiology

## 2018-11-10 DIAGNOSIS — K649 Unspecified hemorrhoids: Secondary | ICD-10-CM | POA: Diagnosis not present

## 2018-11-10 DIAGNOSIS — I10 Essential (primary) hypertension: Secondary | ICD-10-CM | POA: Diagnosis not present

## 2018-11-23 NOTE — Progress Notes (Signed)
HPI The patient presents for followup of atrial fibrillation.  She returns for follow-up.  She wore a Holter in Dec and had good rate control.  Since I last saw her she has done well.   The patient denies any new symptoms such as chest discomfort, neck or arm discomfort. There has been no new shortness of breath, PND or orthopnea. There have been no reported palpitations, presyncope or syncope.     Allergies  Allergen Reactions  . Cortisone Other (See Comments)    REACTION: weight gain  . Statins Other (See Comments)    Myalgias     Current Outpatient Medications  Medication Sig Dispense Refill  . apixaban (ELIQUIS) 5 MG TABS tablet Take 1 tablet (5 mg total) by mouth 2 (two) times daily. 180 tablet 1  . Cholecalciferol (VITAMIN D3) 5000 units CAPS Take 1 capsule by mouth daily.    . Fluticasone Propionate (FLONASE NA) Flonase    . meclizine (ANTIVERT) 25 MG tablet Take 25 mg by mouth. Takes for dizzy flares as follows:  Take 2 tablets , wait 1 hour and take one more tablet    . metoprolol succinate (TOPROL-XL) 50 MG 24 hr tablet Take 1 tablet (50 mg total) by mouth 2 (two) times daily. Take with or immediately following a meal. (Patient taking differently: Take 50 mg by mouth daily. Take with or immediately following a meal.) 60 tablet 11  . Multiple Vitamins-Minerals (CENTRUM SILVER ADULT 50+ PO) Take by mouth.     No current facility-administered medications for this visit.     Past Medical History:  Diagnosis Date  . Anemia   . Arthritis   . Asthma   . Atrial fibrillation ([place])    a. 11/2006 Echo: EF 50-55%, mild MR;  b. On Pradaxa & rate control  . Breast cancer ([place])    with lymph node resection (16 nodes)  . Diverticulosis   . Dyslipidemia   . Gallstones   . Hemorrhoids   . Hyperplastic colon polyp 2005  . Hypothyroidism   . Obesity   . Osteoarthritis    a. knees/hips    Past Surgical History:  Procedure Laterality Date  . BREAST LUMPECTOMY     left  .  LYMPH NODE DISSECTION     due to breast cancer  . mole excision     ROS:    She has joint pains, she has had mild rectal bleeding stopped, she has constipation.  PHYSICAL EXAM BP (!) 138/94 (BP Location: Right Arm, Patient Position: Sitting, Cuff Size: Large)   Pulse 95   Ht 5\' 7"  (1.702 m)   Wt 269 lb (122 kg)   BMI 42.13 kg/m  GENERAL:  Well appearing NECK:  No jugular venous distention, waveform within normal limits, carotid upstroke brisk and symmetric, no bruits, no thyromegaly LUNGS:  Clear to auscultation bilaterally CHEST:  Unremarkable HEART:  PMI not displaced or sustained,S1 and S2 within normal limits, no S3, no clicks, no rubs, no murmurs, irregular ABD:  Flat, positive bowel sounds normal in frequency in pitch, no bruits, no rebound, no guarding, no midline pulsatile mass, no hepatomegaly, no splenomegaly EXT:  2 plus pulses throughout, no edema, no cyanosis no clubbing     EKG: Atrial fibrillation, rate 95, axis within normal limits, intervals within normal limits, no acute ST-T wave changes.   ASSESSMENT AND PLAN  Atrial fibrillation:   [name] has a CHA2DS2 - VASc score of 4 with  a risk of stroke of 4%.  She tolerates anticoagulation and has had reasonable rate control.  No change in therapy.  HTN:  Her blood pressure is slightly elevated here.  However, it is been reasonable other times when is been checked and she says it is reasonable at home so I will make no change.  HL: She has recently been tried on multiple statins.  She's intolerant of them all.  I do not think there is an indication in this situation for PCSK9.  No change in therapy.  MORBID OBESITY:   She is certainly not at her peak weight.  I encouraged more weight loss but I do applaud that she has been able to keep down from her highest level.

## 2018-11-25 ENCOUNTER — Ambulatory Visit (INDEPENDENT_AMBULATORY_CARE_PROVIDER_SITE_OTHER): Payer: Medicare Other | Admitting: Cardiology

## 2018-11-25 ENCOUNTER — Encounter: Payer: Self-pay | Admitting: Cardiology

## 2018-11-25 VITALS — BP 138/94 | HR 95 | Ht 67.0 in | Wt 269.0 lb

## 2018-11-25 DIAGNOSIS — I4821 Permanent atrial fibrillation: Secondary | ICD-10-CM | POA: Diagnosis not present

## 2018-11-25 NOTE — Patient Instructions (Signed)
Medication Instructions:  Continue current medications  If you need a refill on your cardiac medications before your next appointment, please call your pharmacy.  Labwork: None Ordered   Testing/Procedures: None Ordered  Follow-Up: You will need a follow up appointment in 1 Year.  Please call our office 2 months in advance to schedule this appointment.  You may see Dr Hochrein or one of the following Advanced Practice Providers on your designated Care Team:   Rhonda Barrett, PA-C . Kathryn Lawrence, DNP, ANP   At [facility], you and your health needs are our priority.  As part of our continuing mission to provide you with exceptional heart care, we have created designated Provider Care Teams.  These Care Teams include your primary Cardiologist (physician) and Advanced Practice Providers (APPs -  Physician Assistants and Nurse Practitioners) who all work together to provide you with the care you need, when you need it.  Thank you for choosing [facility] at [facility]!!     

## 2018-11-27 ENCOUNTER — Ambulatory Visit (INDEPENDENT_AMBULATORY_CARE_PROVIDER_SITE_OTHER): Payer: Medicare Other | Admitting: Gastroenterology

## 2018-11-27 ENCOUNTER — Encounter: Payer: Self-pay | Admitting: Gastroenterology

## 2018-11-27 ENCOUNTER — Telehealth: Payer: Self-pay

## 2018-11-27 ENCOUNTER — Other Ambulatory Visit: Payer: Self-pay

## 2018-11-27 VITALS — BP 164/84 | HR 84 | Temp 98.1°F | Ht 64.25 in | Wt 271.0 lb

## 2018-11-27 DIAGNOSIS — Z86010 Personal history of colonic polyps: Secondary | ICD-10-CM

## 2018-11-27 DIAGNOSIS — Z7901 Long term (current) use of anticoagulants: Secondary | ICD-10-CM | POA: Diagnosis not present

## 2018-11-27 DIAGNOSIS — I4821 Permanent atrial fibrillation: Secondary | ICD-10-CM | POA: Diagnosis not present

## 2018-11-27 DIAGNOSIS — K5900 Constipation, unspecified: Secondary | ICD-10-CM | POA: Diagnosis not present

## 2018-11-27 DIAGNOSIS — K625 Hemorrhage of anus and rectum: Secondary | ICD-10-CM

## 2018-11-27 NOTE — Progress Notes (Signed)
[facility] Progress Note  Chief Complaint: Rectal bleeding  Subjective  History:  [name] [name] was referred back to see [place] for recent rectal bleeding.  She recently had an episode of constipation that she attributes to having eaten a large amount of collard greens.  She then had a large bowel movement and some blood in the stool on the paper for 1 to 2 days.  It resolved and has not recurred since then. I saw her in clinic 2 years ago for hematochezia that I suspected to have been self-limited diverticular bleeding.  She had a colonoscopy with Dr. Olevia Perches September 2015 with diverticulosis, hyperplastic polyps and a 10 to 12 mm tubular adenoma ( prep reportedly fair).  She is on oral anticoagulation for atrial fibrillation. ROS: Cardiovascular:  no chest pain Respiratory: no dyspnea  The patient's Past Medical, Family and Social History were reviewed and are on file in the EMR.  Objective:  Med list reviewed  Current Outpatient Medications:  .  apixaban (ELIQUIS) 5 MG TABS tablet, Take 1 tablet (5 mg total) by mouth 2 (two) times daily., Disp: 180 tablet, Rfl: 1 .  Cholecalciferol (VITAMIN D3) 5000 units CAPS, Take 1 capsule by mouth daily., Disp: , Rfl:  .  ELDERBERRY PO, Take 1 tablet by mouth daily., Disp: , Rfl:  .  Fluticasone Propionate (FLONASE NA), as needed. , Disp: , Rfl:  .  meclizine (ANTIVERT) 25 MG tablet, Take 25 mg by mouth. Takes for dizzy flares as follows:  Take 2 tablets , wait 1 hour and take one more tablet, Disp: , Rfl:  .  metoprolol succinate (TOPROL-XL) 50 MG 24 hr tablet, Take 1 tablet (50 mg total) by mouth 2 (two) times daily. Take with or immediately following a meal. (Patient taking differently: Take 50 mg by mouth daily. Take with or immediately following a meal.), Disp: 60 tablet, Rfl: 11   Vital signs in last 24 hrs: Vitals:   11/27/18 1027  BP: (!) 164/84  Pulse: 84  Temp: 98.1 F (36.7 C)    Physical Exam  Well-appearing, pleasant and  conversational  HEENT: sclera anicteric, oral mucosa moist without lesions  Neck: supple, no thyromegaly, JVD or lymphadenopathy  Cardiac: Irregular without murmurs, S1S2 heard, no peripheral edema  Pulm: clear to auscultation bilaterally, normal RR and effort noted  Abdomen: soft, no tenderness, with active bowel sounds. No guarding or palpable hepatosplenomegaly, limited by body habitus  Skin; warm and dry, no jaundice or rash  Rectal exam deferred   Data: Ejection fraction normal on last echocardiogram in 2008  @ASSESSMENTPLANBEGIN @ Assessment: Encounter Diagnoses  Name Primary?  . Rectal bleeding Yes  . Acute constipation   . Personal history of colonic polyps   . Chronic anticoagulation   . Permanent atrial fibrillation     Most likely self-limited benign anorectal bleeding from an episode of constipation.  It should be further evaluated without neoplasia because of prior colon polyp and reportedly fair preparation on that examination. I recommended a colonoscopy, and she is agreeable after discussion of procedure and risks.  The benefits and risks of the planned procedure were described in detail with the patient or (when appropriate) their health care proxy.  Risks were outlined as including, but not limited to, bleeding, infection, perforation, adverse medication reaction leading to cardiac or pulmonary decompensation.  The limitation of incomplete mucosal visualization was also discussed.  No guarantees or warranties were given.  Patient at increased risk for cardiopulmonary complications of procedure  due to medical comorbidities. She will need to be off oral anticoagulation 2 days prior if her cardiologist is agreeable.  She understands the risk of stroke during the time she is off anticoagulation.   Total time 30 minutes, over half spent face-to-face with patient in counseling and coordination of care.   Nelida Meuse III

## 2018-11-27 NOTE — Telephone Encounter (Signed)
[facility] Pre-operative Risk Assessment     Request for surgical clearance:     Endoscopy Procedure  What type of surgery is being performed?     Colonoscopy   When is this surgery scheduled?     12-31-2018  What type of clearance is required ?   Pharmacy  Are there any medications that need to be held prior to surgery and how long? Yes, Eliquis. 2 days prior  Practice name and name of physician performing surgery?      [facility]  What is your office phone and fax number?      Phone- [phone]  [phone]  Anesthesia type (None, local, MAC, general) ?       MAC

## 2018-11-27 NOTE — Telephone Encounter (Signed)
Pharm please address eliquis 

## 2018-11-27 NOTE — Addendum Note (Signed)
Addended by: Elias Else on: 11/27/2018 04:14 PM   Modules accepted: Orders

## 2018-11-27 NOTE — Patient Instructions (Signed)
If you are age 78 or older, your body mass index should be between 23-30. Your Body mass index is 46.16 kg/m. If this is out of the aforementioned range listed, please consider follow up with your Primary Care Provider.  If you are age 73 or younger, your body mass index should be between 19-25. Your Body mass index is 46.16 kg/m. If this is out of the aformentioned range listed, please consider follow up with your Primary Care Provider.   You have been scheduled for a colonoscopy. Please follow written instructions given to you at your visit today.  Please pick up your prep supplies at the pharmacy within the next 1-3 days. If you use inhalers (even only as needed), please bring them with you on the day of your procedure. Your physician has requested that you go to www.startemmi.com and enter the access code given to you at your visit today. This web site gives a general overview about your procedure. However, you should still follow specific instructions given to you by our office regarding your preparation for the procedure.  You will be contacted by our office prior to your procedure for directions on holding your Eliquis.  If you do not hear from our office 1 week prior to your scheduled procedure, please call [phone] to discuss.  It was a pleasure to see you today!  Dr. Loletha Carrow

## 2018-11-28 NOTE — Telephone Encounter (Signed)
Placed lab orders per pharmacy's recommendations, patient scheduled to come in for blood work on 12/18/18.

## 2018-11-28 NOTE — Telephone Encounter (Signed)
Patient with diagnosis of Afib on Eliquis for anticoagulation.    Procedure: colonoscopy Date of procedure: 12/31/18  CHADS2-VASc score of  4 (CHF, HTN, AGE, DM2, stroke/tia x 2, CAD, AGE, female)  Patient has not had a BMET since 2017 (in our system, [facility], or [facility]). Will need to obtain so that renal function can be addressed. Will obtain CBC as well since will need for Eliquis f/u.

## 2018-12-01 NOTE — Telephone Encounter (Signed)
Will defer to pharmacist to make recommendation regarding eliquis once patient obtain BMET on 12/18/2018

## 2018-12-02 NOTE — Telephone Encounter (Addendum)
Covering pre-op box today. See exchange below - sending FYI to GI CMA that initiated conversation. Please let us know if lab are obtained before that time. Elizer Bostic PA-C

## 2018-12-18 ENCOUNTER — Other Ambulatory Visit: Payer: Medicare Other

## 2018-12-18 NOTE — Telephone Encounter (Signed)
Colonoscopy has been cancelled at this time due to [place] restrictions.

## 2018-12-31 ENCOUNTER — Encounter: Payer: Medicare Other | Admitting: Gastroenterology

## 2019-01-14 ENCOUNTER — Encounter: Payer: Medicare Other | Admitting: Gastroenterology

## 2019-01-27 ENCOUNTER — Telehealth: Payer: Self-pay | Admitting: *Deleted

## 2019-01-27 NOTE — Telephone Encounter (Signed)
I called Mrs [name] [name] to RS her colonoscopy for rectal bleeding and hx of colon polyps-  Mrs [name] [name] explained that she wanted to wait until late August/September for her colonoscopy- She stated she was not concerned with waiting , that it was not an emergency, despite the reasons for her procedure- she states she does want to have her colon, but due to [place] 19 she wishes to wait as stated above- she said Dr Loletha Carrow told her to " stay in "  I placed a recall in for her  # [phone], and she said she will call to RS if we do not call her from the recall.  Lelan Pons PV

## 2019-03-02 ENCOUNTER — Encounter: Payer: Self-pay | Admitting: Cardiology

## 2019-03-02 DIAGNOSIS — Z0000 Encounter for general adult medical examination without abnormal findings: Secondary | ICD-10-CM | POA: Diagnosis not present

## 2019-03-02 DIAGNOSIS — Z7189 Other specified counseling: Secondary | ICD-10-CM | POA: Diagnosis not present

## 2019-03-02 DIAGNOSIS — I10 Essential (primary) hypertension: Secondary | ICD-10-CM | POA: Diagnosis not present

## 2019-03-02 DIAGNOSIS — R7301 Impaired fasting glucose: Secondary | ICD-10-CM | POA: Diagnosis not present

## 2019-03-02 DIAGNOSIS — E782 Mixed hyperlipidemia: Secondary | ICD-10-CM | POA: Diagnosis not present

## 2019-03-02 DIAGNOSIS — I482 Chronic atrial fibrillation, unspecified: Secondary | ICD-10-CM | POA: Diagnosis not present

## 2019-03-16 DIAGNOSIS — J4530 Mild persistent asthma, uncomplicated: Secondary | ICD-10-CM | POA: Diagnosis not present

## 2019-03-16 DIAGNOSIS — I10 Essential (primary) hypertension: Secondary | ICD-10-CM | POA: Diagnosis not present

## 2019-03-16 DIAGNOSIS — R7301 Impaired fasting glucose: Secondary | ICD-10-CM | POA: Diagnosis not present

## 2019-03-16 DIAGNOSIS — I482 Chronic atrial fibrillation, unspecified: Secondary | ICD-10-CM | POA: Diagnosis not present

## 2019-03-16 DIAGNOSIS — E782 Mixed hyperlipidemia: Secondary | ICD-10-CM | POA: Diagnosis not present

## 2019-03-16 DIAGNOSIS — Z7189 Other specified counseling: Secondary | ICD-10-CM | POA: Diagnosis not present

## 2019-03-16 DIAGNOSIS — D573 Sickle-cell trait: Secondary | ICD-10-CM | POA: Diagnosis not present

## 2019-03-18 ENCOUNTER — Encounter: Payer: Self-pay | Admitting: Gastroenterology

## 2019-03-26 ENCOUNTER — Telehealth: Payer: Self-pay

## 2019-03-26 NOTE — Telephone Encounter (Signed)
See recommendation by clinical pharmacist, recommend labwork (CBC and BMET) to help determine the duration to hold eliquis prior to the procedure.

## 2019-03-26 NOTE — Telephone Encounter (Addendum)
Patient returned my call I informed her of the need to have blood work drawn to help determine how long to hold the Eliquis. Patient stated that she had blood work done last month by her PCP. Will out in a request to get PCP to fax over labs for last month to the office.

## 2019-03-26 NOTE — Telephone Encounter (Signed)
Tried calling patient on both home and mobile numbers listed for her and after multiple rings the phones hang up. Will try calling patient again or reaching out to her sister in law Kandice Robinsons.

## 2019-03-26 NOTE — Telephone Encounter (Signed)
Called the patient's sister in law Kandice Robinsons and left a message for the patient to give our office a call back to be given information about labs for holding her Eliquis for her upcoming procedure/surgery.

## 2019-03-26 NOTE — Telephone Encounter (Signed)
New clearance request has been sent

## 2019-03-26 NOTE — Telephone Encounter (Signed)
Pt takes Eliquis for afib with CHADS2VASc score of 4 (age x2, sex, HTN). SCr was last checked 02/17/18 and was normal at 0.9. Recommend rechecking BMET and CBC since last labs were drawn > 1 year ago. If renal function remains stable, ok to hold Eliquis for 2 days prior to procedure.

## 2019-03-26 NOTE — Telephone Encounter (Signed)
[facility] Pre-operative Risk Assessment     Request for surgical clearance:     Endoscopy Procedure  What type of surgery is being performed?     Colonoscopy   When is this surgery scheduled?     04-21-2019  What type of clearance is required ?   Pharmacy  Are there any medications that need to be held prior to surgery and how long? Yes, Eliquis  Practice name and name of physician performing surgery?      [facility]  What is your office phone?      Phone- [phone]    Anesthesia type (None, local, MAC, general) ?       MAC

## 2019-03-26 NOTE — Telephone Encounter (Signed)
PharmD to evaluate how long to hold eliquis prior to colonoscopy

## 2019-03-30 NOTE — Telephone Encounter (Signed)
Labs received from PCP Drawn 03/02/2019.  Hemoglobin/hematocrit 14/41.5, SCR 1.0 eGFR > 60.  Will have full labs scanned into Epic.    Based on this information CrCl 91.4.   CHADS2-VASc score is 4 (age x 2, hypertension, female)  Ok to hold Eliquis x 2 days prior to procedure

## 2019-03-30 NOTE — Telephone Encounter (Signed)
Pt has been notified and aware. She states clear understanding.

## 2019-04-03 ENCOUNTER — Other Ambulatory Visit: Payer: Self-pay

## 2019-04-03 ENCOUNTER — Ambulatory Visit: Payer: Medicare Other | Admitting: *Deleted

## 2019-04-03 VITALS — Ht 64.25 in | Wt 274.0 lb

## 2019-04-03 DIAGNOSIS — Z86010 Personal history of colonic polyps: Secondary | ICD-10-CM

## 2019-04-03 DIAGNOSIS — K625 Hemorrhage of anus and rectum: Secondary | ICD-10-CM

## 2019-04-03 MED ORDER — PLENVU 140 G PO SOLR: 1.0000 | ORAL | 0 refills | Status: DC

## 2019-04-03 NOTE — Progress Notes (Signed)
No egg or soy allergy known to patient  No issues with past sedation with any surgeries  or procedures, no intubation problems  No diet pills per patient No home 02 use per patient  No blood thinners per patient  Pt states past   issues with constipation  Due to Goiter- if eats right no issues - will take 2 tsp canola or olive oil and it helps  No A fib or A flutter  EMMI video sent to pt's e mail   Pt has a Plenvu prep at home-   Pt verified name, DOB, address and insurance during PV today. Pt mailed instruction packet to included paper to complete and mail back to [facility] with addressed and stamped envelope, Emmi video, copy of consent form to read and not return, and instructions. . PV completed over the phone. Pt encouraged to call with questions or issues   Pt is aware that care partner will wait in the car during procedure; if they feel like they will be too hot to wait in the car; they may wait in the lobby.  We want them to wear a mask (we do not have any that we can provide them), practice social distancing, and we will check their temperatures when they get here.  I did remind patient that their care partner needs to stay in the parking lot the entire time. Pt will wear mask into building.

## 2019-04-16 ENCOUNTER — Other Ambulatory Visit: Payer: Self-pay

## 2019-04-20 ENCOUNTER — Telehealth: Payer: Self-pay | Admitting: Gastroenterology

## 2019-04-20 NOTE — Telephone Encounter (Signed)

## 2019-04-21 ENCOUNTER — Encounter: Payer: Self-pay | Admitting: Gastroenterology

## 2019-04-21 ENCOUNTER — Other Ambulatory Visit: Payer: Self-pay

## 2019-04-21 ENCOUNTER — Ambulatory Visit (AMBULATORY_SURGERY_CENTER): Payer: Medicare Other | Admitting: Gastroenterology

## 2019-04-21 ENCOUNTER — Encounter: Payer: Medicare Other | Admitting: Gastroenterology

## 2019-04-21 VITALS — BP 149/81 | HR 69 | Temp 98.3°F | Resp 19 | Ht 64.0 in | Wt 274.0 lb

## 2019-04-21 DIAGNOSIS — Z86010 Personal history of colonic polyps: Secondary | ICD-10-CM | POA: Diagnosis not present

## 2019-04-21 DIAGNOSIS — D122 Benign neoplasm of ascending colon: Secondary | ICD-10-CM

## 2019-04-21 DIAGNOSIS — Z1211 Encounter for screening for malignant neoplasm of colon: Secondary | ICD-10-CM | POA: Diagnosis not present

## 2019-04-21 DIAGNOSIS — D120 Benign neoplasm of cecum: Secondary | ICD-10-CM

## 2019-04-21 MED ORDER — SODIUM CHLORIDE 0.9 % IV SOLN: 500.0000 mL | Freq: Once | INTRAVENOUS | Status: DC

## 2019-04-21 NOTE — Progress Notes (Signed)
To pacu, VSS. Report to [email or web address] 

## 2019-04-21 NOTE — Progress Notes (Signed)
[name] [name] - Temp Riki Sheer, LPN - VS   Pt's states no medical or surgical changes since previsit or office visit.

## 2019-04-21 NOTE — Op Note (Signed)
[facility] [place] Patient Name: [name] [name] Procedure Date: 04/21/2019 10:19 AM MRN: [id number] Endoscopist: Mallie Mussel L. Loletha Carrow , MD Age: 78 Referring MD:  Date of Birth: 1941/04/03 Gender: Female Account #: [id number] Procedure:                Colonoscopy Indications:              Surveillance: Personal history of adenomatous                            polyps on last colonoscopy 5 years ago (TA > 38mm ;                            05/2014) Medicines:                Monitored Anesthesia Care Procedure:                Pre-Anesthesia Assessment:                           - Prior to the procedure, a History and Physical                            was performed, and patient medications and                            allergies were reviewed. The patient's tolerance of                            previous anesthesia was also reviewed. The risks                            and benefits of the procedure and the sedation                            options and risks were discussed with the patient.                            All questions were answered, and informed consent                            was obtained. Prior Anticoagulants: The patient has                            taken Eliquis (apixaban), last dose was 2 days                            prior to procedure. ASA Grade Assessment: III - A                            patient with severe systemic disease. After                            reviewing the risks and benefits, the patient was  deemed in satisfactory condition to undergo the                            procedure.                           After obtaining informed consent, the colonoscope                            was passed under direct vision. Throughout the                            procedure, the patient's blood pressure, pulse, and                            oxygen saturations were monitored continuously. The                             Colonoscope was introduced through the anus and                            advanced to the the cecum, identified by                            appendiceal orifice and ileocecal valve. The                            colonoscopy was performed without difficulty. The                            patient tolerated the procedure well. The quality                            of the bowel preparation was good. The ileocecal                            valve, appendiceal orifice, and rectum were                            photographed. Scope In: 10:27:51 AM Scope Out: 10:46:22 AM Scope Withdrawal Time: 0 hours 11 minutes 29 seconds  Total Procedure Duration: 0 hours 18 minutes 31 seconds  Findings:                 The perianal and digital rectal examinations were                            normal.                           There was a lipoma in the cecum.                           Four sessile polyps were found in the proximal  ascending colon, mid ascending colon and cecum. The                            polyps were 4 to 6 mm in size. These polyps were                            removed with a cold snare. Resection and retrieval                            were complete.                           Diverticula were found in the left colon and right                            colon.                           The exam was otherwise without abnormality on                            direct and retroflexion views. Complications:            No immediate complications. Estimated Blood Loss:     Estimated blood loss was minimal. Impression:               - Lipoma in the cecum.                           - Four 4 to 6 mm polyps in the proximal ascending                            colon, in the mid ascending colon and in the cecum,                            removed with a cold snare. Resected and retrieved.                           - Diverticulosis in the left colon and in the right                             colon.                           - The examination was otherwise normal on direct                            and retroflexion views. Recommendation:           - Patient has a contact number available for                            emergencies. The signs and symptoms of potential  delayed complications were discussed with the                            patient. Return to normal activities tomorrow.                            Written discharge instructions were provided to the                            patient.                           - Resume previous diet.                           - Resume Eliquis (apixaban) at prior dose tomorrow.                           - Await pathology results.                           - Based on current guidelines, no repeat                            surveillance colonoscopy. Clotilde Loth L. Loletha Carrow, MD 04/21/2019 10:52:23 AM This report has been signed electronically.

## 2019-04-21 NOTE — Progress Notes (Signed)
Called to room to assist during endoscopic procedure.  Patient ID and intended procedure confirmed with present staff. Received instructions for my participation in the procedure from the performing physician.  

## 2019-04-21 NOTE — Patient Instructions (Signed)
Handouts Provided:  Polyps  RESUME ELIQUIS at prior dose tomorrow.   YOU HAD AN ENDOSCOPIC PROCEDURE TODAY AT [facility]:   Refer to the procedure report that was given to you for any specific questions about what was found during the examination.  If the procedure report does not answer your questions, please call your gastroenterologist to clarify.  If you requested that your care partner not be given the details of your procedure findings, then the procedure report has been included in a sealed envelope for you to review at your convenience later.  YOU SHOULD EXPECT: Some feelings of bloating in the abdomen. Passage of more gas than usual.  Walking can help get rid of the air that was put into your GI tract during the procedure and reduce the bloating. If you had a lower endoscopy (such as a colonoscopy or flexible sigmoidoscopy) you may notice spotting of blood in your stool or on the toilet paper. If you underwent a bowel prep for your procedure, you may not have a normal bowel movement for a few days.  Please Note:  You might notice some irritation and congestion in your nose or some drainage.  This is from the oxygen used during your procedure.  There is no need for concern and it should clear up in a day or so.  SYMPTOMS TO REPORT IMMEDIATELY:   Following lower endoscopy (colonoscopy or flexible sigmoidoscopy):  Excessive amounts of blood in the stool  Significant tenderness or worsening of abdominal pains  Swelling of the abdomen that is new, acute  Fever of 100F or higher   For urgent or emergent issues, a gastroenterologist can be reached at any hour by calling [phone].   DIET:  We do recommend a small meal at first, but then you may proceed to your regular diet.  Drink plenty of fluids but you should avoid alcoholic beverages for 24 hours.  ACTIVITY:  You should plan to take it easy for the rest of today and you should NOT DRIVE or use heavy machinery  until tomorrow (because of the sedation medicines used during the test).    FOLLOW UP: Our staff will call the number listed on your records 48-72 hours following your procedure to check on you and address any questions or concerns that you may have regarding the information given to you following your procedure. If we do not reach you, we will leave a message.  We will attempt to reach you two times.  During this call, we will ask if you have developed any symptoms of COVID 19. If you develop any symptoms (ie: fever, flu-like symptoms, shortness of breath, cough etc.) before then, please call [phone].  If you test positive for Covid 19 in the 2 weeks post procedure, please call and report this information to [place].    If any biopsies were taken you will be contacted by phone or by letter within the next 1-3 weeks.  Please call us at [phone] if you have not heard about the biopsies in 3 weeks.    SIGNATURES/CONFIDENTIALITY: You and/or your care partner have signed paperwork which will be entered into your electronic medical record.  These signatures attest to the fact that that the information above on your After Visit Summary has been reviewed and is understood.  Full responsibility of the confidentiality of this discharge information lies with you and/or your care-partner.

## 2019-04-23 ENCOUNTER — Telehealth: Payer: Self-pay

## 2019-04-23 NOTE — Telephone Encounter (Signed)
  Follow up Call-  Call back number 04/21/2019  Post procedure Call Back phone  # [phone] hm  Permission to leave phone message Yes  Some recent data might be hidden     Patient questions:  Do you have a fever, pain , or abdominal swelling? No. Pain Score  0 *  Have you tolerated food without any problems? Yes.    Have you been able to return to your normal activities? Yes.    Do you have any questions about your discharge instructions: Diet   No. Medications  No. Follow up visit  No.  Do you have questions or concerns about your Care? No.  Actions: * If pain score is 4 or above: No action needed, pain <4.  1. Have you developed a fever since your procedure? No  2.   Have you had an respiratory symptoms (SOB or cough) since your procedure? No  3.   Have you tested positive for COVID 19 since your procedure No  4.   Have you had any family members/close contacts diagnosed with the COVID 19 since your procedure?  No   If yes to any of these questions please route to Joylene John, RN and Alphonsa Gin, RN.

## 2019-04-28 ENCOUNTER — Encounter: Payer: Self-pay | Admitting: Gastroenterology

## 2019-05-20 DIAGNOSIS — Z23 Encounter for immunization: Secondary | ICD-10-CM | POA: Diagnosis not present

## 2019-05-22 ENCOUNTER — Other Ambulatory Visit: Payer: Self-pay | Admitting: *Deleted

## 2019-05-22 MED ORDER — APIXABAN 5 MG PO TABS: 5.0000 mg | ORAL_TABLET | Freq: Two times a day (BID) | ORAL | 1 refills | Status: DC

## 2019-05-22 NOTE — Telephone Encounter (Signed)
68F 124.3 kg, SCr 1.0 (6/20), LOV [facility] 3/20

## 2019-07-21 DIAGNOSIS — H26493 Other secondary cataract, bilateral: Secondary | ICD-10-CM | POA: Diagnosis not present

## 2019-07-21 DIAGNOSIS — Z961 Presence of intraocular lens: Secondary | ICD-10-CM | POA: Diagnosis not present

## 2019-07-21 DIAGNOSIS — H348322 Tributary (branch) retinal vein occlusion, left eye, stable: Secondary | ICD-10-CM | POA: Diagnosis not present

## 2019-08-12 DIAGNOSIS — H26491 Other secondary cataract, right eye: Secondary | ICD-10-CM | POA: Diagnosis not present

## 2019-08-17 ENCOUNTER — Encounter: Payer: Self-pay | Admitting: Cardiology

## 2019-08-17 DIAGNOSIS — I10 Essential (primary) hypertension: Secondary | ICD-10-CM | POA: Diagnosis not present

## 2019-08-17 DIAGNOSIS — I482 Chronic atrial fibrillation, unspecified: Secondary | ICD-10-CM | POA: Diagnosis not present

## 2019-08-17 DIAGNOSIS — E782 Mixed hyperlipidemia: Secondary | ICD-10-CM | POA: Diagnosis not present

## 2019-08-24 DIAGNOSIS — R7301 Impaired fasting glucose: Secondary | ICD-10-CM | POA: Diagnosis not present

## 2019-08-24 DIAGNOSIS — I482 Chronic atrial fibrillation, unspecified: Secondary | ICD-10-CM | POA: Diagnosis not present

## 2019-08-24 DIAGNOSIS — E782 Mixed hyperlipidemia: Secondary | ICD-10-CM | POA: Diagnosis not present

## 2019-08-24 DIAGNOSIS — I10 Essential (primary) hypertension: Secondary | ICD-10-CM | POA: Diagnosis not present

## 2019-10-06 ENCOUNTER — Telehealth: Payer: Self-pay | Admitting: Cardiology

## 2019-10-06 NOTE — Telephone Encounter (Signed)
We are recommending the COVID-19 vaccine to all of our patients. Cardiac medications (including blood thinners) should not deter anyone from being vaccinated and there is no need to hold any of those medications prior to vaccine administration.     Currently, there is a hotline to call (active 09/25/19) to schedule vaccination appointments as no walk-ins will be accepted.   Number: [phone].    If an appointment is not available please go to [email or web address] to sign up for notification when additional vaccine appointments are available.   If you have further questions or concerns about the vaccine process, please visit [email or web address] or contact your primary care physician.   I have informed patient of instructions.   

## 2019-10-13 DIAGNOSIS — Z1231 Encounter for screening mammogram for malignant neoplasm of breast: Secondary | ICD-10-CM | POA: Diagnosis not present

## 2019-11-16 ENCOUNTER — Other Ambulatory Visit: Payer: Self-pay | Admitting: Cardiology

## 2019-12-24 DIAGNOSIS — Z7189 Other specified counseling: Secondary | ICD-10-CM | POA: Insufficient documentation

## 2019-12-24 DIAGNOSIS — E785 Hyperlipidemia, unspecified: Secondary | ICD-10-CM | POA: Insufficient documentation

## 2019-12-24 NOTE — Progress Notes (Signed)
Cardiology Office Note   Date:  12/25/2019   ID:  [name] [name], DOB Nov 04, 1940, MRN [id number]  PCP:  [name] Seashore, MD  Cardiologist:   No primary care provider on file.   No chief complaint on file.     History of Present Illness: [name] [name] is a 79 y.o. female who presents for followup of atrial fibrillation.  Since I last saw her she has had no new cardiac complaints.  She is in atrial fibrillation.  Her rate is a little bit elevated but she thinks this is because she did not sleep well the last couple of nights.  She is having pain in her hip and some pain in the dorsum of the right foot.  She thinks this is related to varicose veins that she is having.  She is not having any palpitations, presyncope or syncope.  She has had no new shortness of breath, PND or orthopnea.  She has been managing to keep her weight down.  She has had some mild left leg edema.   Past Medical History:  Diagnosis Date  . Anemia   . Arthritis   . Asthma   . Atrial fibrillation ([place])    a. 11/2006 Echo: EF 50-55%, mild MR;  b. On Pradaxa & rate control  . Breast cancer ([place])    with lymph node resection (16 nodes)left  . Cataract    removed bilaterally   . Constipation    due to Goiter per pt.   . Diverticulosis   . Dyslipidemia   . Gallstones   . Hemorrhoids   . History of radiation therapy 1997  . Hyperplastic colon polyp 2005  . Hypertension    controlled 132/74 per pt   . Hypothyroidism   . Obesity   . Osteoarthritis    a. knees/hips    Past Surgical History:  Procedure Laterality Date  . BREAST LUMPECTOMY     left  . CATARACT EXTRACTION, BILATERAL Bilateral    with lens placement   . COLONOSCOPY    . EYE SURGERY     retinal surgery to remove blood from behind the eye   . FOOT SURGERY     to remove growth from the bottom of the foot   . LYMPH NODE DISSECTION     due to breast cancer  . mole excision    . POLYPECTOMY       Current Outpatient  Medications  Medication Sig Dispense Refill  . Cholecalciferol (VITAMIN D3) 5000 units CAPS Take 1 capsule by mouth daily.    . colchicine 0.6 MG tablet as needed.     Marland Kitchen ELIQUIS 5 MG TABS tablet TAKE 1 TABLET BY MOUTH TWICE A DAY 180 tablet 1  . Fluticasone Propionate (FLONASE NA) as needed.     . meclizine (ANTIVERT) 25 MG tablet Take 25 mg by mouth. Takes for dizzy flares as follows:  Take 2 tablets , wait 1 hour and take one more tablet    . metoprolol succinate (TOPROL-XL) 50 MG 24 hr tablet Take 1 tablet (50 mg total) by mouth 2 (two) times daily. Take with or immediately following a meal. (Patient taking differently: Take 50 mg by mouth daily. Take with or immediately following a meal.) 60 tablet 11  . Multiple Vitamins-Minerals (ALIVE WOMENS 50+ PO) Take by mouth.     No current facility-administered medications for this visit.    Allergies:   Cortisone and Statins    ROS:  Please see the history of present illness.   Otherwise, review of systems are positive for none.   All other systems are reviewed and negative.    PHYSICAL EXAM: VS:  BP (!) 156/100   Pulse (!) 114   Temp (!) 97 F (36.1 C)   Ht 5\' 6"  (1.676 m)   Wt 266 lb 12.8 oz (121 kg)   SpO2 91%   BMI 43.06 kg/m  , BMI Body mass index is 43.06 kg/m. GENERAL:  Well appearing NECK:  No jugular venous distention, waveform within normal limits, carotid upstroke brisk and symmetric, no bruits, no thyromegaly LUNGS:  Clear to auscultation bilaterally CHEST:  Unremarkable HEART:  PMI not displaced or sustained,S1 and S2 within normal limits, no S3, no clicks, no rubs, no murmurs, irregular ABD:  Flat, positive bowel sounds normal in frequency in pitch, no bruits, no rebound, no guarding, no midline pulsatile mass, no hepatomegaly, no splenomegaly EXT:  2 plus pulses throughout, no edema, no cyanosis no clubbing   EKG:  EKG is ordered today. The ekg ordered today demonstrates atrial fibrillation rate 114, axis within  normal limits, intervals within normal limits, no acute ST-T wave changes.   Recent Labs: No results found for requested labs within last 8760 hours.    Lipid Panel    Component Value Date/Time   CHOL 157 08/23/2016 1227   TRIG 47 08/23/2016 1227   HDL 50 (L) 08/23/2016 1227   CHOLHDL 3.1 08/23/2016 1227   VLDL 9 08/23/2016 1227   LDLCALC 98 08/23/2016 1227      Wt Readings from Last 3 Encounters:  12/25/19 266 lb 12.8 oz (121 kg)  04/21/19 274 lb (124.3 kg)  04/03/19 274 lb (124.3 kg)      Other studies Reviewed: Additional studies/ records that were reviewed today include: None. Review of the above records demonstrates:  Please see elsewhere in the note.     ASSESSMENT AND PLAN:  Atrial fibrillation:   [name] [name] has a CHA2DS2 - VASc score of 4.   She tolerates this.  She assures me that her heart rate is down at home and I am going to have her check that and verify it.  She does not want to be increased her beta-blocker because she felt lightheaded with this dose previously.  However, if her heart rate is elevated I would probably add just a half a pill of the metoprolol in the evening.  HTN:  Her blood pressure is elevated but she again assures me that it is always 120/80 at home and she will keep a blood pressure diary.   HL: She has been intolerant of multiple statins.  She does not have an indication for PCSK9.  MORBID OBESITY:   She is trying to lose weight and doing better with diet  VARICOSE VEINS:  I will refer her to [facility].   COVID EDUCATION:  She is probably wearing her vaccine button.    Current medicines are reviewed at length with the patient today.  The patient does not have concerns regarding medicines.  The following changes have been made:  no change  Labs/ tests ordered today include: None  Orders Placed This Encounter  Procedures  . Ambulatory referral to Vascular Surgery  . EKG 12-Lead     Disposition:   FU with  APP in six months.     Signed, Minus Breeding, MD  12/25/2019 1:03 PM    [facility]

## 2019-12-25 ENCOUNTER — Encounter: Payer: Self-pay | Admitting: Cardiology

## 2019-12-25 ENCOUNTER — Ambulatory Visit (INDEPENDENT_AMBULATORY_CARE_PROVIDER_SITE_OTHER): Payer: Medicare Other | Admitting: Cardiology

## 2019-12-25 ENCOUNTER — Other Ambulatory Visit: Payer: Self-pay

## 2019-12-25 VITALS — BP 156/100 | HR 114 | Temp 97.0°F | Ht 66.0 in | Wt 266.8 lb

## 2019-12-25 DIAGNOSIS — I4821 Permanent atrial fibrillation: Secondary | ICD-10-CM | POA: Diagnosis not present

## 2019-12-25 DIAGNOSIS — Z7189 Other specified counseling: Secondary | ICD-10-CM | POA: Diagnosis not present

## 2019-12-25 DIAGNOSIS — E785 Hyperlipidemia, unspecified: Secondary | ICD-10-CM | POA: Diagnosis not present

## 2019-12-25 DIAGNOSIS — I83891 Varicose veins of right lower extremities with other complications: Secondary | ICD-10-CM | POA: Diagnosis not present

## 2019-12-25 DIAGNOSIS — I10 Essential (primary) hypertension: Secondary | ICD-10-CM

## 2019-12-25 NOTE — Patient Instructions (Addendum)
Medication Instructions:  No changes *If you need a refill on your cardiac medications before your next appointment, please call your pharmacy*  Lab Work: None ordered this visit  Testing/Procedures: None ordered this visit  Follow-Up: At [facility], you and your health needs are our priority.  As part of our continuing mission to provide you with exceptional heart care, we have created designated Provider Care Teams.  These Care Teams include your primary Cardiologist (physician) and Advanced Practice Providers (APPs -  Physician Assistants and Nurse Practitioners) who all work together to provide you with the care you need, when you need it.  We recommend signing up for the patient portal called "MyChart".  Sign up information is provided on this After Visit Summary.  MyChart is used to connect with patients for Virtual Visits (Telemedicine).  Patients are able to view lab/test results, encounter notes, upcoming appointments, etc.  Non-urgent messages can be sent to your provider as well.   To learn more about what you can do with MyChart, go to [email or web address].    Your next appointment:   12 month(s)  You will receive a reminder letter in the mail two months in advance. If you don't receive a letter, please call our office to schedule the follow-up appointment.  The format for your next appointment:   In Person  Provider:   Minus Breeding, MD  Other Instructions Referral to Vascular Surgery

## 2020-01-05 ENCOUNTER — Telehealth: Payer: Self-pay | Admitting: Cardiology

## 2020-01-05 DIAGNOSIS — Z6841 Body Mass Index (BMI) 40.0 and over, adult: Secondary | ICD-10-CM | POA: Diagnosis not present

## 2020-01-05 DIAGNOSIS — Z124 Encounter for screening for malignant neoplasm of cervix: Secondary | ICD-10-CM | POA: Diagnosis not present

## 2020-01-05 NOTE — Telephone Encounter (Signed)
Follow up     Pt is calling and says the paperwork is original copys from a physical she had with her PCP  She said Dr Percival Spanish asked to keep it to make copies and she did not have the originals returned.  She is asking for her original copies of her Physical from her PCP returned    Please Advise

## 2020-01-05 NOTE — Telephone Encounter (Signed)
Patient is calling in regards to paperwork she had in the office during appointment on 12/25/19. Patient states during appointment on 12/25/19 Dr. Percival Spanish made copies of paperwork, however paperwork was not returned. She is requesting to have paperwork mailed to her home address. Please assist.

## 2020-01-05 NOTE — Telephone Encounter (Signed)
Left message for patient to call back and let staff know what the paperwork was in regards to.

## 2020-01-05 NOTE — Telephone Encounter (Signed)
Left a message with medical records at Dr. Mathis Fare office requesting a copy of last AVS and physical for patient. No copy or original paperwork found for patient at this office. Will await call back from PCPs office with status.

## 2020-01-25 ENCOUNTER — Other Ambulatory Visit: Payer: Self-pay | Admitting: *Deleted

## 2020-01-25 DIAGNOSIS — M7989 Other specified soft tissue disorders: Secondary | ICD-10-CM

## 2020-01-26 ENCOUNTER — Telehealth (HOSPITAL_COMMUNITY): Payer: Self-pay

## 2020-01-26 NOTE — Telephone Encounter (Signed)

## 2020-01-27 ENCOUNTER — Other Ambulatory Visit: Payer: Self-pay

## 2020-01-27 ENCOUNTER — Encounter: Payer: Self-pay | Admitting: Vascular Surgery

## 2020-01-27 ENCOUNTER — Ambulatory Visit (HOSPITAL_COMMUNITY)
Admission: RE | Admit: 2020-01-27 | Discharge: 2020-01-27 | Disposition: A | Discharge status: Home / Self Care | Payer: Medicare Other | Source: Ambulatory Visit | Attending: Vascular Surgery | Admitting: Vascular Surgery

## 2020-01-27 ENCOUNTER — Ambulatory Visit (INDEPENDENT_AMBULATORY_CARE_PROVIDER_SITE_OTHER): Payer: Medicare Other | Admitting: Vascular Surgery

## 2020-01-27 VITALS — BP 156/88 | HR 77 | Temp 97.9°F | Resp 16 | Ht 66.0 in | Wt 268.0 lb

## 2020-01-27 DIAGNOSIS — I83811 Varicose veins of right lower extremities with pain: Secondary | ICD-10-CM | POA: Diagnosis not present

## 2020-01-27 DIAGNOSIS — M7989 Other specified soft tissue disorders: Secondary | ICD-10-CM | POA: Diagnosis not present

## 2020-01-27 NOTE — Progress Notes (Signed)
Referring Physician: Dr. Percival Spanish  Patient name: [name] [name] MRN: [id number] DOB: 07-17-1941 Sex: female  REASON FOR CONSULT: Right leg varicose veins  HPI: [name] [name] is a 79 y.o. female, who has had progressive slow enlarging varicose veins in the right leg over the last several years.  She has no history of DVT.  She has lost some weight over the last several years going from 300 pounds down to 268 pounds.  She has not really had any procedures done on her legs in the past.  She states she is not really interested in an operation on her varicose veins.  She has not worn compression stockings in the past.  She has no family history of varicose veins.  Other medical problems include arthritis, asthma, prior history of breast cancer, hypertension arthritis all of which are currently controlled.  She has some occasional swelling in her legs.  She does also have some occasional fullness heaviness and aching.  This is relieved by elevation.  Past Medical History:  Diagnosis Date  . Anemia   . Arthritis   . Asthma   . Atrial fibrillation ([place])    a. 11/2006 Echo: EF 50-55%, mild MR;  b. On Pradaxa & rate control  . Breast cancer ([place])    with lymph node resection (16 nodes)left  . Cataract    removed bilaterally   . Constipation    due to Goiter per pt.   . Diverticulosis   . Dyslipidemia   . Gallstones   . Hemorrhoids   . History of radiation therapy 1997  . Hyperplastic colon polyp 2005  . Hypertension    controlled 132/74 per pt   . Hypothyroidism   . Obesity   . Osteoarthritis    a. knees/hips   Past Surgical History:  Procedure Laterality Date  . BREAST LUMPECTOMY     left  . CATARACT EXTRACTION, BILATERAL Bilateral    with lens placement   . COLONOSCOPY    . EYE SURGERY     retinal surgery to remove blood from behind the eye   . FOOT SURGERY     to remove growth from the bottom of the foot   . LYMPH NODE DISSECTION     due to breast cancer    . mole excision    . POLYPECTOMY      Family History  Problem Relation Age of Onset  . Heart disease Father   . Hypertension Father   . Other Father        Died in a work related incident   . Alzheimer's disease Mother   . Breast cancer Mother   . Stroke Brother 80  . Hypertension Brother   . Prostate cancer Maternal Grandfather   . Colon cancer Maternal Aunt   . Prostate cancer Maternal Uncle   . Colon polyps Neg Hx   . Esophageal cancer Neg Hx   . Rectal cancer Neg Hx   . Stomach cancer Neg Hx     SOCIAL HISTORY: Social History   Socioeconomic History  . Marital status: Widowed    Spouse name: Not on file  . Number of children: 0  . Years of education: Not on file  . Highest education level: Not on file  Occupational History  . Occupation: Retired Pharmacist, hospital  Tobacco Use  . Smoking status: Never Smoker  . Smokeless tobacco: Never Used  Substance and Sexual Activity  . Alcohol use: No  . Drug use:  No  . Sexual activity: Not on file  Other Topics Concern  . Not on file  Social History Narrative  . Not on file   Social Determinants of Health   Financial Resource Strain:   . Difficulty of Paying Living Expenses:   Food Insecurity:   . Worried About Charity fundraiser in the Last Year:   . Arboriculturist in the Last Year:   Transportation Needs:   . Film/video editor (Medical):   Marland Kitchen Lack of Transportation (Non-Medical):   Physical Activity:   . Days of Exercise per Week:   . Minutes of Exercise per Session:   Stress:   . Feeling of Stress :   Social Connections:   . Frequency of Communication with Friends and Family:   . Frequency of Social Gatherings with Friends and Family:   . Attends Religious Services:   . Active Member of Clubs or Organizations:   . Attends Archivist Meetings:   Marland Kitchen Marital Status:   Intimate Partner Violence:   . Fear of Current or Ex-Partner:   . Emotionally Abused:   Marland Kitchen Physically Abused:   . Sexually Abused:      Allergies  Allergen Reactions  . Cortisone Other (See Comments)    REACTION: weight gain  . Statins Other (See Comments)    Myalgias     Current Outpatient Medications  Medication Sig Dispense Refill  . Cholecalciferol (VITAMIN D3) 5000 units CAPS Take 1 capsule by mouth daily.    . colchicine 0.6 MG tablet as needed.     Marland Kitchen ELIQUIS 5 MG TABS tablet TAKE 1 TABLET BY MOUTH TWICE A DAY 180 tablet 1  . Fluticasone Propionate (FLONASE NA) as needed.     . meclizine (ANTIVERT) 25 MG tablet Take 25 mg by mouth. Takes for dizzy flares as follows:  Take 2 tablets , wait 1 hour and take one more tablet    . metoprolol succinate (TOPROL-XL) 50 MG 24 hr tablet Take 1 tablet (50 mg total) by mouth 2 (two) times daily. Take with or immediately following a meal. (Patient taking differently: Take 50 mg by mouth daily. Take with or immediately following a meal.) 60 tablet 11  . Multiple Vitamins-Minerals (ALIVE WOMENS 50+ PO) Take by mouth.     No current facility-administered medications for this visit.    ROS:   General:  No weight loss, Fever, chills  HEENT: No recent headaches, no nasal bleeding, no visual changes, no sore throat  Neurologic: No dizziness, blackouts, seizures. No recent symptoms of stroke or mini- stroke. No recent episodes of slurred speech, or temporary blindness.  Cardiac: No recent episodes of chest pain/pressure, no shortness of breath at rest.  No shortness of breath with exertion.  Denies history of atrial fibrillation or irregular heartbeat  Vascular: No history of rest pain in feet.  No history of claudication.  No history of non-healing ulcer, No history of DVT   Pulmonary: No home oxygen, no productive cough, no hemoptysis,  No asthma or wheezing  Musculoskeletal:  [X]  Arthritis, [ ]  Low back pain,  [X]  Joint pain  Hematologic:No history of hypercoagulable state.  No history of easy bleeding.  No history of anemia  Gastrointestinal: No hematochezia or  melena,  No gastroesophageal reflux, no trouble swallowing  Urinary: [ ]  chronic Kidney disease, [ ]  on HD - [ ]  MWF or [ ]  TTHS, [ ]  Burning with urination, [ ]  Frequent urination, [ ]  Difficulty  urinating;   Skin: No rashes  Psychological: No history of anxiety,  No history of depression   Physical Examination  Vitals:   01/27/20 1533  BP: (!) 156/88  Pulse: 77  Resp: 16  Temp: 97.9 F (36.6 C)  TempSrc: Temporal  SpO2: 100%  Weight: 268 lb (121.6 kg)  Height: 5\' 6"  (1.676 m)    Body mass index is 43.26 kg/m.  General:  Alert and oriented, no acute distress HEENT: Normal Neck: No JVD Cardiac: Regular Rate and Rhythm Skin: No rash, scattered varicosities right medial calf all about 4 to 5 mm diameter no ulceration Extremity Pulses:  2+ radial, brachial, femoral, dorsalis pedis pulses bilaterally Musculoskeletal: No deformity or edema  Neurologic: Upper and lower extremity motor 5/5 and symmetric  DATA:  Patient had a venous duplex exam today which showed no evidence of superficial or deep venous reflux.  Varicosities in the right medial calf were noted.  ASSESSMENT: Symptomatic varicose veins with occasional swelling in the right leg.  Overall fairly mild symptoms.  She does not have evidence of reflux in the superficial or deep system.  In light of this she would not be a candidate for laser ablation.  She does have several varicosities in the right medial calf and I discussed with her whether or not she would want to proceed with stab avulsions of these for symptomatic relief.  However, she would prefer compression therapy alone for now.  She was fitted today for bilateral 15 to 20 mm compression stockings.    Patient was reassured she is not at risk of limb loss and that veins only because usually nuisance type symptoms such as the ones that she is currently exhibiting.  She was relieved to know this.  I did discuss with her the nuisance type symptoms that can occur with  varicose veins and she states that these are currently tolerable to her.  She will elevate her legs as much as possible when she is not on her feet to relieve her symptoms.  She will try to wear compression as much as possible.   PLAN:  She will follow-up with [place] on an as-needed basis.    Ruta Hinds, MD [facility] Office: [phone] Pager: [phone]

## 2020-02-22 ENCOUNTER — Other Ambulatory Visit: Payer: Self-pay | Admitting: Cardiology

## 2020-02-25 DIAGNOSIS — L249 Irritant contact dermatitis, unspecified cause: Secondary | ICD-10-CM | POA: Diagnosis not present

## 2020-02-29 DIAGNOSIS — Z0000 Encounter for general adult medical examination without abnormal findings: Secondary | ICD-10-CM | POA: Diagnosis not present

## 2020-02-29 DIAGNOSIS — E782 Mixed hyperlipidemia: Secondary | ICD-10-CM | POA: Diagnosis not present

## 2020-02-29 DIAGNOSIS — I10 Essential (primary) hypertension: Secondary | ICD-10-CM | POA: Diagnosis not present

## 2020-02-29 DIAGNOSIS — R7301 Impaired fasting glucose: Secondary | ICD-10-CM | POA: Diagnosis not present

## 2020-02-29 DIAGNOSIS — I482 Chronic atrial fibrillation, unspecified: Secondary | ICD-10-CM | POA: Diagnosis not present

## 2020-03-07 DIAGNOSIS — M109 Gout, unspecified: Secondary | ICD-10-CM | POA: Diagnosis not present

## 2020-03-07 DIAGNOSIS — E782 Mixed hyperlipidemia: Secondary | ICD-10-CM | POA: Diagnosis not present

## 2020-03-07 DIAGNOSIS — K219 Gastro-esophageal reflux disease without esophagitis: Secondary | ICD-10-CM | POA: Diagnosis not present

## 2020-03-07 DIAGNOSIS — J4530 Mild persistent asthma, uncomplicated: Secondary | ICD-10-CM | POA: Diagnosis not present

## 2020-03-07 DIAGNOSIS — E049 Nontoxic goiter, unspecified: Secondary | ICD-10-CM | POA: Diagnosis not present

## 2020-03-07 DIAGNOSIS — D573 Sickle-cell trait: Secondary | ICD-10-CM | POA: Diagnosis not present

## 2020-03-07 DIAGNOSIS — I482 Chronic atrial fibrillation, unspecified: Secondary | ICD-10-CM | POA: Diagnosis not present

## 2020-03-07 DIAGNOSIS — R7301 Impaired fasting glucose: Secondary | ICD-10-CM | POA: Diagnosis not present

## 2020-03-07 DIAGNOSIS — I10 Essential (primary) hypertension: Secondary | ICD-10-CM | POA: Diagnosis not present

## 2020-05-03 DIAGNOSIS — L723 Sebaceous cyst: Secondary | ICD-10-CM | POA: Diagnosis not present

## 2020-05-11 DIAGNOSIS — L820 Inflamed seborrheic keratosis: Secondary | ICD-10-CM | POA: Diagnosis not present

## 2020-05-21 ENCOUNTER — Other Ambulatory Visit: Payer: Self-pay | Admitting: Cardiology

## 2020-06-03 DIAGNOSIS — Z23 Encounter for immunization: Secondary | ICD-10-CM | POA: Diagnosis not present

## 2020-06-27 DIAGNOSIS — Z23 Encounter for immunization: Secondary | ICD-10-CM | POA: Diagnosis not present

## 2020-08-05 ENCOUNTER — Telehealth: Payer: Self-pay | Admitting: Cardiology

## 2020-08-05 NOTE — Telephone Encounter (Signed)
Advise patient to fill Eliquis before end of year, so that she has enough to get her into January without issue - when she is down to about a 2 weeks supply in 2022, she should call and we can make the transition at that time.

## 2020-08-05 NOTE — Telephone Encounter (Signed)
Patient aware and verbalized understanding. °

## 2020-08-05 NOTE — Telephone Encounter (Signed)
Returned call to patient, she has Medicare Part A and B and [place] to continue Eliquis until end of year and would make pharmD aware to assist with transition if needed in 2022.

## 2020-08-05 NOTE — Telephone Encounter (Signed)
Pt c/o medication issue:  1. Name of Medication: ELIQUIS 5 MG TABS tablet  2. How are you currently taking this medication (dosage and times per day)? 1 tablet twice a day  3. Are you having a reaction (difficulty breathing--STAT)? no  4. What is your medication issue? Patient states [facility] will not be working with her insurance pharmacy benefit plan as of the first of January and that she will need to either pay 100% of the cost or be on another medication. Please advise.

## 2020-10-11 DIAGNOSIS — M109 Gout, unspecified: Secondary | ICD-10-CM | POA: Diagnosis not present

## 2020-10-11 DIAGNOSIS — R7301 Impaired fasting glucose: Secondary | ICD-10-CM | POA: Diagnosis not present

## 2020-10-11 DIAGNOSIS — D500 Iron deficiency anemia secondary to blood loss (chronic): Secondary | ICD-10-CM | POA: Diagnosis not present

## 2020-10-11 DIAGNOSIS — E782 Mixed hyperlipidemia: Secondary | ICD-10-CM | POA: Diagnosis not present

## 2020-10-11 DIAGNOSIS — I482 Chronic atrial fibrillation, unspecified: Secondary | ICD-10-CM | POA: Diagnosis not present

## 2020-10-11 DIAGNOSIS — I10 Essential (primary) hypertension: Secondary | ICD-10-CM | POA: Diagnosis not present

## 2020-10-20 DIAGNOSIS — E782 Mixed hyperlipidemia: Secondary | ICD-10-CM | POA: Diagnosis not present

## 2020-10-20 DIAGNOSIS — M109 Gout, unspecified: Secondary | ICD-10-CM | POA: Diagnosis not present

## 2020-10-20 DIAGNOSIS — I10 Essential (primary) hypertension: Secondary | ICD-10-CM | POA: Diagnosis not present

## 2020-10-20 DIAGNOSIS — I4821 Permanent atrial fibrillation: Secondary | ICD-10-CM | POA: Diagnosis not present

## 2020-10-20 DIAGNOSIS — D573 Sickle-cell trait: Secondary | ICD-10-CM | POA: Diagnosis not present

## 2020-10-20 DIAGNOSIS — R7301 Impaired fasting glucose: Secondary | ICD-10-CM | POA: Diagnosis not present

## 2020-10-20 DIAGNOSIS — K219 Gastro-esophageal reflux disease without esophagitis: Secondary | ICD-10-CM | POA: Diagnosis not present

## 2020-11-09 DIAGNOSIS — Z1231 Encounter for screening mammogram for malignant neoplasm of breast: Secondary | ICD-10-CM | POA: Diagnosis not present

## 2020-11-15 NOTE — Telephone Encounter (Signed)
Could you call the pharmacy Wed am and see if the Eliquis is covered for 2022?  If not, pass to PharmD and we'll see about switching to Xarelto

## 2020-11-15 NOTE — Telephone Encounter (Signed)
Pt called in and has about a week worth of the Eliquis.  She is calling in to find out what she is going to need to do about this med or switching meds?    Best number [phone] Pharmacy - [facility] [address]

## 2020-11-16 MED ORDER — APIXABAN 5 MG PO TABS
5.0000 mg | ORAL_TABLET | Freq: Two times a day (BID) | ORAL | 1 refills | Status: DC
Start: 2020-11-16 — End: 2021-05-23

## 2020-11-16 NOTE — Telephone Encounter (Signed)
Called and explained to the pt that the eliquis is covered at $47 dollars a month. Pt voiced understanding and gratitude.

## 2020-11-16 NOTE — Telephone Encounter (Signed)
Medication covered. Please call patient to inform and send refill if needed.

## 2020-11-16 NOTE — Telephone Encounter (Signed)
Called the pharmacy to ask if eliquis was covered and they stated that it was $47 monthly. Will route to pharmd pool as instructed by kristin alvstad to see if there is anything else that I need to do

## 2020-11-16 NOTE — Addendum Note (Signed)
Addended by: Allean Found on: 11/16/2020 08:47 AM   Modules accepted: Orders

## 2020-12-20 DIAGNOSIS — I482 Chronic atrial fibrillation, unspecified: Secondary | ICD-10-CM | POA: Insufficient documentation

## 2020-12-20 NOTE — Progress Notes (Signed)
Cardiology Office Note   Date:  12/21/2020   ID:  [name] [name], DOB October 31, 1940, MRN [id number]  PCP:  Merrilee Seashore, MD  Cardiologist:   Minus Breeding, MD   Chief Complaint  Patient presents with  . Atrial Fibrillation      History of Present Illness: [name] [name] is a 80 y.o. female who presents for followup of atrial fibrillation.  Since I last saw her she has done well.  She does walk a couple times a week.  She does her ribs remotely.  She denies any cardiovascular symptoms.  She does not feel her atrial fibrillation.  She denies any chest pressure, neck or arm discomfort.  She has some problems with a little bit of leg pain with varicose veins and she did see DBS but was not a candidate for ablation.  They suggested compression stockings which she does not like to wear.   Past Medical History:  Diagnosis Date  . Anemia   . Arthritis   . Asthma   . Atrial fibrillation ([place])    a. 11/2006 Echo: EF 50-55%, mild MR;  b. On Pradaxa & rate control  . Breast cancer ([place])    with lymph node resection (16 nodes)left  . Cataract    removed bilaterally   . Constipation    due to Goiter per pt.   . Diverticulosis   . Dyslipidemia   . Gallstones   . Hemorrhoids   . History of radiation therapy 1997  . Hyperplastic colon polyp 2005  . Hypertension    controlled 132/74 per pt   . Hypothyroidism   . Obesity   . Osteoarthritis    a. knees/hips    Past Surgical History:  Procedure Laterality Date  . BREAST LUMPECTOMY     left  . CATARACT EXTRACTION, BILATERAL Bilateral    with lens placement   . COLONOSCOPY    . EYE SURGERY     retinal surgery to remove blood from behind the eye   . FOOT SURGERY     to remove growth from the bottom of the foot   . LYMPH NODE DISSECTION     due to breast cancer  . mole excision    . POLYPECTOMY       Current Outpatient Medications  Medication Sig Dispense Refill  . apixaban (ELIQUIS) 5 MG TABS tablet  Take 1 tablet (5 mg total) by mouth 2 (two) times daily. 180 tablet 1  . Cholecalciferol (VITAMIN D3) 5000 units CAPS Take 1 capsule by mouth daily.    . colchicine 0.6 MG tablet as needed.     . Fluticasone Propionate (FLONASE NA) as needed.     . meclizine (ANTIVERT) 25 MG tablet Take 25 mg by mouth. Takes for dizzy flares as follows:  Take 2 tablets , wait 1 hour and take one more tablet    . metoprolol succinate (TOPROL-XL) 50 MG 24 hr tablet Take 50 mg by mouth. Take One tablet Daily    . Multiple Vitamins-Minerals (ALIVE WOMENS 50+ PO) Take by mouth.     No current facility-administered medications for this visit.    Allergies:   Cortisone and Statins    ROS:  Please see the history of present illness.   Otherwise, review of systems are positive for none.   All other systems are reviewed and negative.    PHYSICAL EXAM: VS:  BP (!) 179/100   Pulse (!) 108   Ht 5' 4.5" (  1.638 m)   Wt 265 lb 3.2 oz (120.3 kg)   SpO2 97%   BMI 44.82 kg/m  , BMI Body mass index is 44.82 kg/m. GENERAL:  Well appearing NECK:  No jugular venous distention, waveform within normal limits, carotid upstroke brisk and symmetric, no bruits, no thyromegaly LUNGS:  Clear to auscultation bilaterally CHEST:  Unremarkable HEART:  PMI not displaced or sustained,S1 and S2 within normal limits, no S3,  no clicks, no rubs, no murmurs, irregular ABD:  Flat, positive bowel sounds normal in frequency in pitch, no bruits, no rebound, no guarding, no midline pulsatile mass, no hepatomegaly, no splenomegaly EXT:  2 plus pulses throughout, no edema, no cyanosis no clubbing, varicose veins   EKG:  EKG is  ordered today. The ekg ordered today demonstrates atrial fibrillation rate 108, axis within normal limits, intervals within normal limits, no acute ST-T wave changes.   Recent Labs: No results found for requested labs within last 8760 hours.    Lipid Panel    Component Value Date/Time   CHOL 157 08/23/2016 1227    TRIG 47 08/23/2016 1227   HDL 50 (L) 08/23/2016 1227   CHOLHDL 3.1 08/23/2016 1227   VLDL 9 08/23/2016 1227   LDLCALC 98 08/23/2016 1227      Wt Readings from Last 3 Encounters:  12/21/20 265 lb 3.2 oz (120.3 kg)  01/27/20 268 lb (121.6 kg)  12/25/19 266 lb 12.8 oz (121 kg)      Other studies Reviewed: Additional studies/ records that were reviewed today include: Labs Review of the above records demonstrates:  Please see elsewhere in the note.     ASSESSMENT AND PLAN:  Atrial fibrillation:  [name] has a CHA2DS2 - VASc score of 4.  No change in therapy.  She tolerates rate control and anticoagulation.  Her heart rate is slightly elevated but she says this is not usual.  I will check this with a diary as below.  HTN:  Her blood pressure is elevated.  She will keep a blood pressure diary 3 times a day and mail the results to me.  She really did not tolerate when I tried to raise her metoprolol in the past.  Elevated but she again assures me that it is always 120/80 at home and she will keep a blood pressure diary.   HL: Total cholesterol 195, triglycerides 73, LDL 130.  This was in January.  She has been intolerant of multiple statins.  She does not have an indication for PCSK9.  MORBID OBESITY:   She has maintained weight loss from a few years ago.  I encouraged more of the same.      Current medicines are reviewed at length with the patient today.  The patient does not have concerns regarding medicines.  The following changes have been made:  no change  Labs/ tests ordered today include: None  Orders Placed This Encounter  Procedures  . EKG 12-Lead     Disposition:   FU with me in one year.    Signed, Minus Breeding, MD  12/21/2020 11:37 AM    [facility]

## 2020-12-21 ENCOUNTER — Encounter: Payer: Self-pay | Admitting: Cardiology

## 2020-12-21 ENCOUNTER — Other Ambulatory Visit: Payer: Self-pay

## 2020-12-21 ENCOUNTER — Ambulatory Visit (INDEPENDENT_AMBULATORY_CARE_PROVIDER_SITE_OTHER): Payer: Medicare Other | Admitting: Cardiology

## 2020-12-21 VITALS — BP 179/100 | HR 108 | Ht 64.5 in | Wt 265.2 lb

## 2020-12-21 DIAGNOSIS — I482 Chronic atrial fibrillation, unspecified: Secondary | ICD-10-CM | POA: Diagnosis not present

## 2020-12-21 DIAGNOSIS — I10 Essential (primary) hypertension: Secondary | ICD-10-CM

## 2020-12-21 DIAGNOSIS — E785 Hyperlipidemia, unspecified: Secondary | ICD-10-CM

## 2020-12-21 NOTE — Patient Instructions (Signed)
Medication Instructions:  No changes *If you need a refill on your cardiac medications before your next appointment, please call your pharmacy*   Lab Work: None ordered If you have labs (blood work) drawn today and your tests are completely normal, you will receive your results only by: . MyChart Message (if you have MyChart) OR . A paper copy in the mail If you have any lab test that is abnormal or we need to change your treatment, we will call you to review the results.   Testing/Procedures: None ordered   Follow-Up: At [facility], you and your health needs are our priority.  As part of our continuing mission to provide you with exceptional heart care, we have created designated Provider Care Teams.  These Care Teams include your primary Cardiologist (physician) and Advanced Practice Providers (APPs -  Physician Assistants and Nurse Practitioners) who all work together to provide you with the care you need, when you need it.  We recommend signing up for the patient portal called "MyChart".  Sign up information is provided on this After Visit Summary.  MyChart is used to connect with patients for Virtual Visits (Telemedicine).  Patients are able to view lab/test results, encounter notes, upcoming appointments, etc.  Non-urgent messages can be sent to your provider as well.   To learn more about what you can do with MyChart, go to [email or web address].    Your next appointment:   12 month(s)  The format for your next appointment:   In Person  Provider:   You may see Dr. Hochrein or one of the following Advanced Practice Providers on your designated Care Team:    Rhonda Barrett, PA-C  Kathryn Lawrence, DNP, ANP     

## 2021-01-12 DIAGNOSIS — M25551 Pain in right hip: Secondary | ICD-10-CM | POA: Diagnosis not present

## 2021-01-12 DIAGNOSIS — M1611 Unilateral primary osteoarthritis, right hip: Secondary | ICD-10-CM | POA: Diagnosis not present

## 2021-01-12 DIAGNOSIS — I83893 Varicose veins of bilateral lower extremities with other complications: Secondary | ICD-10-CM | POA: Diagnosis not present

## 2021-01-16 DIAGNOSIS — Z23 Encounter for immunization: Secondary | ICD-10-CM | POA: Diagnosis not present

## 2021-01-20 DIAGNOSIS — M25551 Pain in right hip: Secondary | ICD-10-CM | POA: Diagnosis not present

## 2021-01-27 ENCOUNTER — Telehealth: Payer: Self-pay | Admitting: *Deleted

## 2021-01-27 NOTE — Telephone Encounter (Signed)
Dr Percival Spanish received patients blood pressure readings and reviewed. Readings sent to medical records to be scanned into Epic No change in therapy, advised patient

## 2021-03-16 DIAGNOSIS — R5383 Other fatigue: Secondary | ICD-10-CM | POA: Diagnosis not present

## 2021-03-16 DIAGNOSIS — I10 Essential (primary) hypertension: Secondary | ICD-10-CM | POA: Diagnosis not present

## 2021-03-16 DIAGNOSIS — E782 Mixed hyperlipidemia: Secondary | ICD-10-CM | POA: Diagnosis not present

## 2021-03-16 DIAGNOSIS — R7301 Impaired fasting glucose: Secondary | ICD-10-CM | POA: Diagnosis not present

## 2021-03-16 DIAGNOSIS — Z0000 Encounter for general adult medical examination without abnormal findings: Secondary | ICD-10-CM | POA: Diagnosis not present

## 2021-03-16 DIAGNOSIS — D500 Iron deficiency anemia secondary to blood loss (chronic): Secondary | ICD-10-CM | POA: Diagnosis not present

## 2021-03-23 DIAGNOSIS — M109 Gout, unspecified: Secondary | ICD-10-CM | POA: Diagnosis not present

## 2021-03-23 DIAGNOSIS — E782 Mixed hyperlipidemia: Secondary | ICD-10-CM | POA: Diagnosis not present

## 2021-03-23 DIAGNOSIS — D573 Sickle-cell trait: Secondary | ICD-10-CM | POA: Diagnosis not present

## 2021-03-23 DIAGNOSIS — I482 Chronic atrial fibrillation, unspecified: Secondary | ICD-10-CM | POA: Diagnosis not present

## 2021-03-23 DIAGNOSIS — R7301 Impaired fasting glucose: Secondary | ICD-10-CM | POA: Diagnosis not present

## 2021-03-23 DIAGNOSIS — I4821 Permanent atrial fibrillation: Secondary | ICD-10-CM | POA: Diagnosis not present

## 2021-03-23 DIAGNOSIS — K219 Gastro-esophageal reflux disease without esophagitis: Secondary | ICD-10-CM | POA: Diagnosis not present

## 2021-03-23 DIAGNOSIS — I10 Essential (primary) hypertension: Secondary | ICD-10-CM | POA: Diagnosis not present

## 2021-05-21 ENCOUNTER — Other Ambulatory Visit: Payer: Self-pay | Admitting: Cardiology

## 2021-05-23 NOTE — Telephone Encounter (Signed)
Prescription refill request for Eliquis received. Indication:atrial fib Last office visit:4/22 [id number] labs Age: 80 Weight:120.3 kg  Prescription refilled

## 2021-06-13 DIAGNOSIS — Z23 Encounter for immunization: Secondary | ICD-10-CM | POA: Diagnosis not present

## 2021-06-13 DIAGNOSIS — R7301 Impaired fasting glucose: Secondary | ICD-10-CM | POA: Diagnosis not present

## 2021-06-13 DIAGNOSIS — I10 Essential (primary) hypertension: Secondary | ICD-10-CM | POA: Diagnosis not present

## 2021-06-13 DIAGNOSIS — E782 Mixed hyperlipidemia: Secondary | ICD-10-CM | POA: Diagnosis not present

## 2021-06-20 ENCOUNTER — Other Ambulatory Visit: Payer: Self-pay | Admitting: Cardiology

## 2021-06-20 DIAGNOSIS — I4821 Permanent atrial fibrillation: Secondary | ICD-10-CM

## 2021-06-20 NOTE — Telephone Encounter (Signed)
Labs needed sending to pharmd pool for orders Prescription refill request for Eliquis received. Indication:afib Last office visit:hochrein 12/21/20 Scr:0.990 MG/ 02/29/2020 overdue  Age: 42f Weight:120.3kg

## 2021-06-21 NOTE — Telephone Encounter (Signed)
RX SENT FOR 1 MONTH. ORDERED LABS AND USED PT INSTRUCTIONS ON THE BOTTLE TO ADVISE PT LABS NEEDED FOR FURTHER REFILLS

## 2021-06-21 NOTE — Telephone Encounter (Signed)
Please place order for BMET and CBC and advise pt to come in for labs for annual DOAC monitoring.

## 2021-07-18 ENCOUNTER — Other Ambulatory Visit: Payer: Self-pay | Admitting: Cardiology

## 2021-07-18 NOTE — Telephone Encounter (Signed)
Looks like this medication was last refilled 06/21/2021 and it states that the patient needs labs prior to getting another refill. Looked in the chart see labs from 06/21/2021 does that count as labs or they need new ones ?

## 2021-08-21 ENCOUNTER — Other Ambulatory Visit: Payer: Self-pay | Admitting: Cardiology

## 2021-08-22 NOTE — Telephone Encounter (Signed)
Prescription refill request for Eliquis received. Indication:Afib  Last office visit:12/21/20 (Hochrein)  Scr:0.99 (02/29/20)  Age: 80 Weight: 120.3kg     Pt labs overdue Called pt, left message. Lab ordered. One month supply sent to requested pharmacy.

## 2021-09-21 ENCOUNTER — Other Ambulatory Visit: Payer: Self-pay | Admitting: Cardiology

## 2021-09-22 NOTE — Telephone Encounter (Signed)
Prescription refill request for Eliquis received. Indication:Afib Last office visit:4/22 [id number] Labs Age: 81 Weight:120.3 kg  Prescription refilled

## 2021-10-09 ENCOUNTER — Other Ambulatory Visit: Payer: Self-pay | Admitting: Cardiology

## 2021-10-09 NOTE — Telephone Encounter (Signed)
Prescription refill request for Eliquis received. Indication:Afib Last office visit:4/22 [id number] Labs Age: 81 Weight:120.3 kg  Prescription refilled

## 2021-11-09 ENCOUNTER — Telehealth: Payer: Self-pay

## 2021-11-09 ENCOUNTER — Other Ambulatory Visit: Payer: Self-pay | Admitting: Cardiology

## 2021-11-09 NOTE — Telephone Encounter (Signed)
Prescription refill request for Eliquis received. Indication:Afib Last office visit:4/22 [id number] Labs Age: 81 Weight:120.3 kg  Prescription refilled

## 2021-11-09 NOTE — Telephone Encounter (Signed)
Reminded patient to come for lab work.  Verbalized understanding

## 2021-11-13 DIAGNOSIS — I4821 Permanent atrial fibrillation: Secondary | ICD-10-CM | POA: Diagnosis not present

## 2021-11-13 LAB — BASIC METABOLIC PANEL
BUN/Creatinine Ratio: 14 (ref 12–28)
BUN: 14 mg/dL (ref 8–27)
CO2: 30 mmol/L — ABNORMAL HIGH (ref 20–29)
Calcium: 9.7 mg/dL (ref 8.7–10.3)
Chloride: 102 mmol/L (ref 96–106)
Creatinine, Ser: 0.99 mg/dL (ref 0.57–1.00)
Glucose: 90 mg/dL (ref 70–99)
Potassium: 4.5 mmol/L (ref 3.5–5.2)
Sodium: 144 mmol/L (ref 134–144)
eGFR: 58 mL/min/{1.73_m2} — ABNORMAL LOW (ref >59)

## 2021-11-13 LAB — CBC
Hematocrit: 41.5 % (ref 34.0–46.6)
Hemoglobin: 13.5 g/dL (ref 11.1–15.9)
MCH: 26.2 pg — ABNORMAL LOW (ref 26.6–33.0)
MCHC: 32.5 g/dL (ref 31.5–35.7)
MCV: 81 fL (ref 79–97)
Platelets: 235 10*3/uL (ref 150–450)
RBC: 5.15 x10E6/uL (ref 3.77–5.28)
RDW: 14.6 % (ref 11.7–15.4)
WBC: 5 10*3/uL (ref 3.4–10.8)

## 2021-11-14 ENCOUNTER — Encounter: Payer: Self-pay | Admitting: *Deleted

## 2021-12-07 ENCOUNTER — Ambulatory Visit: Payer: Medicare Other | Admitting: Cardiology

## 2021-12-10 NOTE — Progress Notes (Signed)
?  ?Cardiology Office Note ? ? ?Date:  12/11/2021  ? ?ID:  [name] [name], DOB [birth year], MRN [id number] ? ?PCP:  Merrilee Seashore, MD  ?Cardiologist:   Minus Breeding, MD ? ? ?Chief Complaint  ?Patient presents with  ? Atrial Fibrillation  ? ? ?  ?History of Present Illness: ?[name] [name] is a 81 y.o. female who presents for followup of atrial fibrillation.  Since I last saw her she has had no new cardiovascular problems.  She really does not notice her atrial fibrillation.  She has had problems with some pain in her right leg and some varicose veins.  She said she changed her diet recently and this seems to have helped.  She gets around slowly because of this and probably some joint discomfort.  She is not having any new shortness of breath, PND or orthopnea.  She is not having any new palpitations, presyncope or syncope.  She denies any chest pressure, neck or arm discomfort.  Her weight is down about 10 pounds in the last few months. ? ? ?Past Medical History:  ?Diagnosis Date  ? Anemia   ? Arthritis   ? Asthma   ? Atrial fibrillation ([place])   ? a. 11/2006 Echo: EF 50-55%, mild MR;  b. On Pradaxa & rate control  ? Breast cancer ([place])   ? with lymph node resection (16 nodes)left  ? Cataract   ? removed bilaterally   ? Constipation   ? due to Goiter per pt.   ? Diverticulosis   ? Dyslipidemia   ? Gallstones   ? Hemorrhoids   ? History of radiation therapy 1997  ? Hyperplastic colon polyp 2005  ? Hypertension   ? controlled 132/74 per pt   ? Hypothyroidism   ? Obesity   ? Osteoarthritis   ? a. knees/hips  ? ? ?Past Surgical History:  ?Procedure Laterality Date  ? BREAST LUMPECTOMY    ? left  ? CATARACT EXTRACTION, BILATERAL Bilateral   ? with lens placement   ? COLONOSCOPY    ? EYE SURGERY    ? retinal surgery to remove blood from behind the eye   ? FOOT SURGERY    ? to remove growth from the bottom of the foot   ? LYMPH NODE DISSECTION    ? due to breast cancer  ? mole excision    ? POLYPECTOMY     ? ? ? ?Current Outpatient Medications  ?Medication Sig Dispense Refill  ? Cholecalciferol (VITAMIN D3) 5000 units CAPS Take 1 capsule by mouth daily.    ? colchicine 0.6 MG tablet as needed.     ? Fluticasone Propionate (FLONASE NA) as needed.     ? meclizine (ANTIVERT) 25 MG tablet Take 25 mg by mouth. Takes for dizzy flares as follows:  Take 2 tablets , wait 1 hour and take one more tablet    ? metoprolol succinate (TOPROL-XL) 50 MG 24 hr tablet TAKE 1 TABLET (50 MG TOTAL) BY MOUTH 2 (TWO) TIMES DAILY. TAKE WITH OR IMMEDIATELY FOLLOWING A MEAL. 180 tablet 3  ? Multiple Vitamins-Minerals (ALIVE WOMENS 50+ PO) Take by mouth.    ? apixaban (ELIQUIS) 5 MG TABS tablet Take 1 tablet (5 mg total) by mouth 2 (two) times daily. 180 tablet 3  ? ?No current facility-administered medications for this visit.  ? ? ?Allergies:   Cortisone and Statins  ? ? ?ROS:  Please see the history of present illness.   Otherwise, review  of systems are positive for none.   All other systems are reviewed and negative.  ? ? ?PHYSICAL EXAM: ?VS:  BP 132/80 (BP Location: Right Wrist, Patient Position: Sitting, Cuff Size: Normal)   Pulse 97   Ht 5' 4.5" (1.638 m)   Wt 262 lb (118.8 kg)   BMI 44.28 kg/m?  , BMI Body mass index is 44.28 kg/m?. ?GENERAL:  Well appearing ?NECK:  No jugular venous distention, waveform within normal limits, carotid upstroke brisk and symmetric, no bruits, no thyromegaly ?LUNGS:  Clear to auscultation bilaterally ?CHEST:  Unremarkable ?HEART:  PMI not displaced or sustained,S1 and S2 within normal limits, no S3,  no clicks, no rubs, 3 out of 6 holosystolic murmur heard at the apex and radiating slightly to the axilla, no diastolic murmurs, irregular ?ABD:  Flat, positive bowel sounds normal in frequency in pitch, no bruits, no rebound, no guarding, no midline pulsatile mass, no hepatomegaly, no splenomegaly ?EXT:  2 plus pulses throughout, trace left leg edema, no cyanosis no clubbing, varicose veins.  ? ?EKG:  EKG  is  ordered today. ?The ekg ordered today demonstrates atrial fibrillation rate 97, axis within normal limits, intervals within normal limits, no acute ST-T wave changes. ? ? ?Recent Labs: ?11/13/2021: BUN 14; Creatinine, Ser 0.99; Hemoglobin 13.5; Platelets 235; Potassium 4.5; Sodium 144  ? ? ?Lipid Panel ?   ?Component Value Date/Time  ? CHOL 157 08/23/2016 1227  ? TRIG 47 08/23/2016 1227  ? HDL 50 (L) 08/23/2016 1227  ? CHOLHDL 3.1 08/23/2016 1227  ? VLDL 9 08/23/2016 1227  ? [place] 98 08/23/2016 1227  ? ?  ? ?Wt Readings from Last 3 Encounters:  ?12/11/21 262 lb (118.8 kg)  ?12/21/20 265 lb 3.2 oz (120.3 kg)  ?01/27/20 268 lb (121.6 kg)  ?  ? ? ?Other studies Reviewed: ?Additional studies/ records that were reviewed today include: Labs ?Review of the above records demonstrates:  Please see elsewhere in the note.   ? ? ?ASSESSMENT AND PLAN: ? ?Atrial fibrillation:  ?[name] has a CHA2DS2 - VASc score of 4.  She tolerates anticoagulation.  I did review recent labs and she is on the appropriate dose.  She had normal hemoglobin recently.  No change in therapy.  ? ?HTN:  ?Her blood pressure is at target.  She will continue the meds as listed.  ? ?HL: ?Total cholesterol was 195.  HDL was 52.  LDL 130.  She has been intolerant of statins and has not wanted to PCSK9.  ? ?MURMUR: ?I will check an echocardiogram ?  ?MORBID OBESITY:   ?She is lost a few pounds and I applauded this and encouraged more the same. ?  ? ? ?Current medicines are reviewed at length with the patient today.  The patient does not have concerns regarding medicines. ? ?The following changes have been made: None ? ?Labs/ tests ordered today include:   ? ?Orders Placed This Encounter  ?Procedures  ? ECHOCARDIOGRAM COMPLETE  ? ? ? ?Disposition:   FU with me in 1 year or sooner if needed ? ? ?Signed, ?Minus Breeding, MD  ?12/11/2021 1:36 PM    ?[place] ? ? ?

## 2021-12-11 ENCOUNTER — Other Ambulatory Visit: Payer: Self-pay

## 2021-12-11 ENCOUNTER — Ambulatory Visit (INDEPENDENT_AMBULATORY_CARE_PROVIDER_SITE_OTHER): Payer: Medicare PPO | Admitting: Cardiology

## 2021-12-11 ENCOUNTER — Encounter: Payer: Self-pay | Admitting: Cardiology

## 2021-12-11 VITALS — BP 132/80 | HR 97 | Ht 64.5 in | Wt 262.0 lb

## 2021-12-11 DIAGNOSIS — I482 Chronic atrial fibrillation, unspecified: Secondary | ICD-10-CM

## 2021-12-11 DIAGNOSIS — E785 Hyperlipidemia, unspecified: Secondary | ICD-10-CM | POA: Diagnosis not present

## 2021-12-11 DIAGNOSIS — I10 Essential (primary) hypertension: Secondary | ICD-10-CM | POA: Diagnosis not present

## 2021-12-11 MED ORDER — APIXABAN 5 MG PO TABS: 5.0000 mg | ORAL_TABLET | Freq: Two times a day (BID) | ORAL | 3 refills | Status: DC

## 2021-12-11 NOTE — Addendum Note (Signed)
Addended by: Venetia Maxon on: 12/11/2021 02:50 PM ? ? Modules accepted: Orders ? ?

## 2021-12-11 NOTE — Patient Instructions (Signed)
Medication Instructions:  ?Your physician recommends that you continue on your current medications as directed. Please refer to the Current Medication list given to you today. ? ?*If you need a refill on your cardiac medications before your next appointment, please call your pharmacy* ? ? ?Testing/Procedures: ?Your physician has requested that you have an echocardiogram. Echocardiography is a painless test that uses sound waves to create images of your heart. It provides your doctor with information about the size and shape of your heart and how well your heart?s chambers and valves are working. This procedure takes approximately one hour. There are no restrictions for this procedure. This procedure will be done at [address] ? ? ?Follow-Up: ?At [facility], you and your health needs are our priority.  As part of our continuing mission to provide you with exceptional heart care, we have created designated Provider Care Teams.  These Care Teams include your primary Cardiologist (physician) and Advanced Practice Providers (APPs -  Physician Assistants and Nurse Practitioners) who all work together to provide you with the care you need, when you need it. ? ?We recommend signing up for the patient portal called "MyChart".  Sign up information is provided on this After Visit Summary.  MyChart is used to connect with patients for Virtual Visits (Telemedicine).  Patients are able to view lab/test results, encounter notes, upcoming appointments, etc.  Non-urgent messages can be sent to your provider as well.   ?To learn more about what you can do with MyChart, go to [email or web address].   ? ?Your next appointment:   ?12 month(s) ? ?The format for your next appointment:   ?In Person ? ?Provider:   ?Minus Breeding, MD  ?

## 2021-12-15 DIAGNOSIS — U071 COVID-19: Secondary | ICD-10-CM | POA: Diagnosis not present

## 2021-12-28 DIAGNOSIS — H348322 Tributary (branch) retinal vein occlusion, left eye, stable: Secondary | ICD-10-CM | POA: Diagnosis not present

## 2021-12-28 DIAGNOSIS — H04123 Dry eye syndrome of bilateral lacrimal glands: Secondary | ICD-10-CM | POA: Diagnosis not present

## 2021-12-28 DIAGNOSIS — H26492 Other secondary cataract, left eye: Secondary | ICD-10-CM | POA: Diagnosis not present

## 2021-12-28 DIAGNOSIS — H43813 Vitreous degeneration, bilateral: Secondary | ICD-10-CM | POA: Diagnosis not present

## 2021-12-28 DIAGNOSIS — Z961 Presence of intraocular lens: Secondary | ICD-10-CM | POA: Diagnosis not present

## 2021-12-29 ENCOUNTER — Ambulatory Visit (HOSPITAL_COMMUNITY): Payer: Medicare Other | Attending: Internal Medicine

## 2021-12-29 DIAGNOSIS — I482 Chronic atrial fibrillation, unspecified: Secondary | ICD-10-CM

## 2021-12-29 DIAGNOSIS — R011 Cardiac murmur, unspecified: Secondary | ICD-10-CM | POA: Diagnosis not present

## 2021-12-29 DIAGNOSIS — I10 Essential (primary) hypertension: Secondary | ICD-10-CM

## 2021-12-29 DIAGNOSIS — E785 Hyperlipidemia, unspecified: Secondary | ICD-10-CM

## 2021-12-29 LAB — ECHOCARDIOGRAM COMPLETE
Calc EF: 51.9 %
MV M vel: 5.76 m/s
MV Peak grad: 132.7 mmHg
Radius: 0.6 cm
S' Lateral: 3.1 cm
Single Plane A2C EF: 50.9 %
Single Plane A4C EF: 50.7 %

## 2022-01-08 DIAGNOSIS — Z01419 Encounter for gynecological examination (general) (routine) without abnormal findings: Secondary | ICD-10-CM | POA: Diagnosis not present

## 2022-01-08 DIAGNOSIS — Z6841 Body Mass Index (BMI) 40.0 and over, adult: Secondary | ICD-10-CM | POA: Diagnosis not present

## 2022-03-22 DIAGNOSIS — I10 Essential (primary) hypertension: Secondary | ICD-10-CM | POA: Diagnosis not present

## 2022-03-22 DIAGNOSIS — E782 Mixed hyperlipidemia: Secondary | ICD-10-CM | POA: Diagnosis not present

## 2022-03-22 DIAGNOSIS — R7301 Impaired fasting glucose: Secondary | ICD-10-CM | POA: Diagnosis not present

## 2022-03-22 DIAGNOSIS — M109 Gout, unspecified: Secondary | ICD-10-CM | POA: Diagnosis not present

## 2022-03-22 DIAGNOSIS — R5383 Other fatigue: Secondary | ICD-10-CM | POA: Diagnosis not present

## 2022-03-29 DIAGNOSIS — D6869 Other thrombophilia: Secondary | ICD-10-CM | POA: Diagnosis not present

## 2022-03-29 DIAGNOSIS — I482 Chronic atrial fibrillation, unspecified: Secondary | ICD-10-CM | POA: Diagnosis not present

## 2022-03-29 DIAGNOSIS — I10 Essential (primary) hypertension: Secondary | ICD-10-CM | POA: Diagnosis not present

## 2022-03-29 DIAGNOSIS — M109 Gout, unspecified: Secondary | ICD-10-CM | POA: Diagnosis not present

## 2022-03-29 DIAGNOSIS — D573 Sickle-cell trait: Secondary | ICD-10-CM | POA: Diagnosis not present

## 2022-03-29 DIAGNOSIS — E782 Mixed hyperlipidemia: Secondary | ICD-10-CM | POA: Diagnosis not present

## 2022-03-29 DIAGNOSIS — K219 Gastro-esophageal reflux disease without esophagitis: Secondary | ICD-10-CM | POA: Diagnosis not present

## 2022-03-29 DIAGNOSIS — R7301 Impaired fasting glucose: Secondary | ICD-10-CM | POA: Diagnosis not present

## 2022-08-19 ENCOUNTER — Other Ambulatory Visit: Payer: Self-pay | Admitting: Cardiology

## 2022-10-04 DIAGNOSIS — D6869 Other thrombophilia: Secondary | ICD-10-CM | POA: Diagnosis not present

## 2022-10-04 DIAGNOSIS — R7301 Impaired fasting glucose: Secondary | ICD-10-CM | POA: Diagnosis not present

## 2022-10-04 DIAGNOSIS — E782 Mixed hyperlipidemia: Secondary | ICD-10-CM | POA: Diagnosis not present

## 2022-10-04 DIAGNOSIS — M109 Gout, unspecified: Secondary | ICD-10-CM | POA: Diagnosis not present

## 2022-10-04 DIAGNOSIS — I482 Chronic atrial fibrillation, unspecified: Secondary | ICD-10-CM | POA: Diagnosis not present

## 2022-10-04 DIAGNOSIS — I10 Essential (primary) hypertension: Secondary | ICD-10-CM | POA: Diagnosis not present

## 2022-10-04 DIAGNOSIS — K219 Gastro-esophageal reflux disease without esophagitis: Secondary | ICD-10-CM | POA: Diagnosis not present

## 2022-10-11 DIAGNOSIS — E782 Mixed hyperlipidemia: Secondary | ICD-10-CM | POA: Diagnosis not present

## 2022-10-11 DIAGNOSIS — D573 Sickle-cell trait: Secondary | ICD-10-CM | POA: Diagnosis not present

## 2022-10-11 DIAGNOSIS — I482 Chronic atrial fibrillation, unspecified: Secondary | ICD-10-CM | POA: Diagnosis not present

## 2022-10-11 DIAGNOSIS — I10 Essential (primary) hypertension: Secondary | ICD-10-CM | POA: Diagnosis not present

## 2022-10-11 DIAGNOSIS — D6869 Other thrombophilia: Secondary | ICD-10-CM | POA: Diagnosis not present

## 2022-10-11 DIAGNOSIS — M109 Gout, unspecified: Secondary | ICD-10-CM | POA: Diagnosis not present

## 2022-10-11 DIAGNOSIS — R7301 Impaired fasting glucose: Secondary | ICD-10-CM | POA: Diagnosis not present

## 2022-10-11 DIAGNOSIS — K219 Gastro-esophageal reflux disease without esophagitis: Secondary | ICD-10-CM | POA: Diagnosis not present

## 2022-11-22 DIAGNOSIS — Z853 Personal history of malignant neoplasm of breast: Secondary | ICD-10-CM | POA: Diagnosis not present

## 2022-11-22 DIAGNOSIS — Z1231 Encounter for screening mammogram for malignant neoplasm of breast: Secondary | ICD-10-CM | POA: Diagnosis not present

## 2022-11-23 ENCOUNTER — Other Ambulatory Visit: Payer: Self-pay | Admitting: Cardiology

## 2022-11-26 NOTE — Telephone Encounter (Signed)
Prescription refill request for Eliquis received. Indication: AF Last office visit: 12/11/21  Vita Barley MD (12/24/22) Scr: 0.99 on 11/13/21  (Requested at upcoming appt.) Age: 82 Weight: 118.8kg  Based on above findings Eliquis '5mg'$  twice daily is the appropriate dose.  Refill approved.

## 2022-12-04 DIAGNOSIS — I340 Nonrheumatic mitral (valve) insufficiency: Secondary | ICD-10-CM | POA: Insufficient documentation

## 2022-12-04 NOTE — Progress Notes (Unsigned)
Cardiology Office Note   Date:  12/05/2022   ID:  [name] [name], DOB [birth year], MRN [id number]  PCP:  Merrilee Seashore, MD  Cardiologist:   Minus Breeding, MD   Chief Complaint  Patient presents with   Atrial Fibrillation     History of Present Illness: [name] [name] is a 82 y.o. female who presents for followup of atrial fibrillation.   She had some moderate or mild mitral regurgitation on an echo last year that I ordered after hearing a murmur.  She is lost from 299 down to 244 pounds with diet.  She feels better.  She is little more mobile.  She still walks with a cane.  She does have some varicosities particular in the right leg and some mild discomfort in the right leg.  She is not having any new shortness of breath, PND or orthopnea.  She is not having any new palpitations, presyncope or syncope.  She does not seem to notice her atrial fibrillation.  She describes having had some "mammogram/heart" study and wonders if I get the results but I cannot find this.  I am not sure what kind of imaging she was talking about and wondered if this could have been some calcium scoring.   Past Medical History:  Diagnosis Date   Anemia    Arthritis    Asthma    Atrial fibrillation ([place])    a. 11/2006 Echo: EF 50-55%, mild MR;  b. On Pradaxa & rate control   Breast cancer ([place])    with lymph node resection (16 nodes)left   Cataract    removed bilaterally    Constipation    due to Goiter per pt.    Diverticulosis    Dyslipidemia    Gallstones    Hemorrhoids    History of radiation therapy 1997   Hyperplastic colon polyp 2005   Hypertension    controlled 132/74 per pt    Hypothyroidism    Obesity    Osteoarthritis    a. knees/hips    Past Surgical History:  Procedure Laterality Date   BREAST LUMPECTOMY     left   CATARACT EXTRACTION, BILATERAL Bilateral    with lens placement    COLONOSCOPY     EYE SURGERY     retinal surgery to remove blood from  behind the eye    FOOT SURGERY     to remove growth from the bottom of the foot    LYMPH NODE DISSECTION     due to breast cancer   mole excision     POLYPECTOMY       Current Outpatient Medications  Medication Sig Dispense Refill   apixaban (ELIQUIS) 5 MG TABS tablet TAKE 1 TABLET BY MOUTH TWICE A DAY 180 tablet 1   Cholecalciferol (VITAMIN D3) 5000 units CAPS Take 1 capsule by mouth daily.     colchicine 0.6 MG tablet as needed.      Fluticasone Propionate (FLONASE NA) as needed.      meclizine (ANTIVERT) 25 MG tablet Take 25 mg by mouth. Takes for dizzy flares as follows:  Take 2 tablets , wait 1 hour and take one more tablet     metoprolol succinate (TOPROL-XL) 100 MG 24 hr tablet TAKE 1/2 TABLET (50 MG TOTAL) BY MOUTH TWICE A DAY .TAKE WITH OR IMMEDIATELY FOLLOWING A MEAL 180 tablet 3   Multiple Vitamins-Minerals (ALIVE WOMENS 50+ PO) Take by mouth.     No current facility-administered  medications for this visit.    Allergies:   Cortisone and Statins    ROS:  Please see the history of present illness.   Otherwise, review of systems are positive for none.   All other systems are reviewed and negative.    PHYSICAL EXAM: VS:  BP (!) 152/90   Pulse 91   Ht 5' 4.5" (1.638 m)   Wt 244 lb (110.7 kg)   SpO2 98%   BMI 41.24 kg/m  , BMI Body mass index is 41.24 kg/m. GENERAL:  Well appearing NECK:  No jugular venous distention, waveform within normal limits, carotid upstroke brisk and symmetric, no bruits, no thyromegaly LUNGS:  Clear to auscultation bilaterally CHEST:  Unremarkable HEART:  PMI not displaced or sustained,S1 and S2 within normal limits, no S3, no clicks, no rubs, 3/6 apical holosystolic murmur, no diastolic murmurs, irregular  ABD:  Flat, positive bowel sounds normal in frequency in pitch, no bruits, no rebound, no guarding, no midline pulsatile mass, no hepatomegaly, no splenomegaly EXT:  2 plus pulses upper and left dorsalis pedis and posttibial's, decreased  right dorsalis pedis and posterior tibialis, no edema, no cyanosis no clubbing  EKG:  EKG is  ordered today. The ekg ordered today demonstrates atrial fibrillation rate 103, axis within normal limits, intervals within normal limits, no acute ST-T wave changes.   Recent Labs: No results found for requested labs within last 365 days.    Lipid Panel    Component Value Date/Time   CHOL 157 08/23/2016 1227   TRIG 47 08/23/2016 1227   HDL 50 (L) 08/23/2016 1227   CHOLHDL 3.1 08/23/2016 1227   VLDL 9 08/23/2016 1227   LDLCALC 98 08/23/2016 1227      Wt Readings from Last 3 Encounters:  12/05/22 244 lb (110.7 kg)  12/11/21 262 lb (118.8 kg)  12/21/20 265 lb 3.2 oz (120.3 kg)      Other studies Reviewed: Additional studies/ records that were reviewed today include: Labs Review of the above records demonstrates:  Please see elsewhere in the note.     ASSESSMENT AND PLAN:  Atrial fibrillation:  [name] has a CHA2DS2 - VASc score of 4.  She tolerates anticoagulation.  No change in therapy.  We will check with recent blood work to make sure she still on the right dose.  HTN:  Her blood pressure is elevated today but this is unusual.  No change in therapy.  She reports normal blood pressures at home.  HL: She has been intolerant of statins and has not wanted PCSK9  MR: I will repeat an echo next month   Obesity:   I am proud of her weight loss encouraged more the same.   Leg pain: She has some discomfort and some decreased pulses.  I will check ABIs.  Other:   I have asked her to bring the results of the heart study because she is not describing having received.  Be happy to review this.  Current medicines are reviewed at length with the patient today.  The patient does not have concerns regarding medicines.  The following changes have been made: None   Labs/ tests ordered today include:    Orders Placed This Encounter  Procedures   Basic  metabolic panel   CBC   EKG 12-Lead   ECHOCARDIOGRAM COMPLETE   VAS [place] ABI WITH/WO TBI     Disposition:   FU with me in one year.    Signed, Minus Breeding,  MD  12/05/2022 1:46 PM    [place]

## 2022-12-05 ENCOUNTER — Ambulatory Visit: Payer: Medicare Other | Attending: Cardiology | Admitting: Cardiology

## 2022-12-05 ENCOUNTER — Encounter: Payer: Self-pay | Admitting: Cardiology

## 2022-12-05 VITALS — BP 152/90 | HR 91 | Ht 64.5 in | Wt 244.0 lb

## 2022-12-05 DIAGNOSIS — I340 Nonrheumatic mitral (valve) insufficiency: Secondary | ICD-10-CM | POA: Insufficient documentation

## 2022-12-05 DIAGNOSIS — I10 Essential (primary) hypertension: Secondary | ICD-10-CM

## 2022-12-05 DIAGNOSIS — M79606 Pain in leg, unspecified: Secondary | ICD-10-CM | POA: Insufficient documentation

## 2022-12-05 DIAGNOSIS — I4821 Permanent atrial fibrillation: Secondary | ICD-10-CM | POA: Insufficient documentation

## 2022-12-05 NOTE — Patient Instructions (Signed)
Medication Instructions:  Your physician recommends that you continue on your current medications as directed. Please refer to the Current Medication list given to you today.  *If you need a refill on your cardiac medications before your next appointment, please call your pharmacy*   Lab Work: Your physician recommends that you return for lab work today: BMET/CBC  If you have labs (blood work) drawn today and your tests are completely normal, you will receive your results only by: MyChart Message (if you have MyChart) OR A paper copy in the mail If you have any lab test that is abnormal or we need to change your treatment, we will call you to review the results.   Testing/Procedures: Your physician has requested that you have an echocardiogram in 1 month. Echocardiography is a painless test that uses sound waves to create images of your heart. It provides your doctor with information about the size and shape of your heart and how well your heart's chambers and valves are working. This procedure takes approximately one hour. There are no restrictions for this procedure. Please do NOT wear cologne, perfume, aftershave, or lotions (deodorant is allowed). Please arrive 15 minutes prior to your appointment time.  Your physician has requested that you have an ankle brachial index (ABI). During this test an ultrasound and blood pressure cuff are used to evaluate the arteries that supply the arms and legs with blood. Allow thirty minutes for this exam. There are no restrictions or special instructions.    Follow-Up: At [facility], you and your health needs are our priority.  As part of our continuing mission to provide you with exceptional heart care, we have created designated Provider Care Teams.  These Care Teams include your primary Cardiologist (physician) and Advanced Practice Providers (APPs -  Physician Assistants and Nurse Practitioners) who all work together to provide you with  the care you need, when you need it.  We recommend signing up for the patient portal called "MyChart".  Sign up information is provided on this After Visit Summary.  MyChart is used to connect with patients for Virtual Visits (Telemedicine).  Patients are able to view lab/test results, encounter notes, upcoming appointments, etc.  Non-urgent messages can be sent to your provider as well.   To learn more about what you can do with MyChart, go to [email or web address].    Your next appointment:   1 year(s)  Provider:   Minus Breeding, MD     Other Instructions We will call you primary care for labs.

## 2022-12-06 ENCOUNTER — Encounter: Payer: Self-pay | Admitting: *Deleted

## 2022-12-06 LAB — BASIC METABOLIC PANEL
BUN/Creatinine Ratio: 15 (ref 12–28)
BUN: 14 mg/dL (ref 8–27)
CO2: 27 mmol/L (ref 20–29)
Calcium: 9.9 mg/dL (ref 8.7–10.3)
Chloride: 106 mmol/L (ref 96–106)
Creatinine, Ser: 0.96 mg/dL (ref 0.57–1.00)
Glucose: 75 mg/dL (ref 70–99)
Potassium: 4.2 mmol/L (ref 3.5–5.2)
Sodium: 147 mmol/L — ABNORMAL HIGH (ref 134–144)
eGFR: 59 mL/min/{1.73_m2} — ABNORMAL LOW (ref >59)

## 2022-12-06 LAB — CBC
Hematocrit: 46.3 % (ref 34.0–46.6)
Hemoglobin: 14.9 g/dL (ref 11.1–15.9)
MCH: 27.2 pg (ref 26.6–33.0)
MCHC: 32.2 g/dL (ref 31.5–35.7)
MCV: 85 fL (ref 79–97)
Platelets: 234 10*3/uL (ref 150–450)
RBC: 5.48 x10E6/uL — ABNORMAL HIGH (ref 3.77–5.28)
RDW: 13.8 % (ref 11.7–15.4)
WBC: 5.7 10*3/uL (ref 3.4–10.8)

## 2022-12-17 ENCOUNTER — Ambulatory Visit (HOSPITAL_COMMUNITY)
Admission: RE | Admit: 2022-12-17 | Payer: Medicare Other | Source: Ambulatory Visit | Attending: Cardiology | Admitting: Cardiology

## 2022-12-21 ENCOUNTER — Other Ambulatory Visit: Payer: Self-pay | Admitting: Cardiology

## 2022-12-21 ENCOUNTER — Ambulatory Visit (HOSPITAL_COMMUNITY)
Admission: RE | Admit: 2022-12-21 | Discharge: 2022-12-21 | Disposition: A | Discharge status: Home / Self Care | Payer: Medicare Other | Source: Ambulatory Visit | Attending: Cardiology | Admitting: Cardiology

## 2022-12-21 DIAGNOSIS — M79606 Pain in leg, unspecified: Secondary | ICD-10-CM

## 2022-12-21 DIAGNOSIS — M79604 Pain in right leg: Secondary | ICD-10-CM | POA: Diagnosis not present

## 2022-12-21 DIAGNOSIS — R0989 Other specified symptoms and signs involving the circulatory and respiratory systems: Secondary | ICD-10-CM

## 2022-12-21 DIAGNOSIS — I340 Nonrheumatic mitral (valve) insufficiency: Secondary | ICD-10-CM

## 2022-12-21 DIAGNOSIS — I10 Essential (primary) hypertension: Secondary | ICD-10-CM

## 2022-12-21 DIAGNOSIS — I4821 Permanent atrial fibrillation: Secondary | ICD-10-CM

## 2022-12-24 ENCOUNTER — Encounter: Payer: Self-pay | Admitting: *Deleted

## 2022-12-24 ENCOUNTER — Ambulatory Visit: Payer: Medicare Other | Admitting: Cardiology

## 2022-12-24 LAB — VAS US ABI WITH/WO TBI
Left ABI: 1.15
Right ABI: 1.16

## 2023-01-02 DIAGNOSIS — H26492 Other secondary cataract, left eye: Secondary | ICD-10-CM | POA: Diagnosis not present

## 2023-01-02 DIAGNOSIS — H35352 Cystoid macular degeneration, left eye: Secondary | ICD-10-CM | POA: Diagnosis not present

## 2023-01-02 DIAGNOSIS — H04123 Dry eye syndrome of bilateral lacrimal glands: Secondary | ICD-10-CM | POA: Diagnosis not present

## 2023-01-02 DIAGNOSIS — Z961 Presence of intraocular lens: Secondary | ICD-10-CM | POA: Diagnosis not present

## 2023-01-02 DIAGNOSIS — H43813 Vitreous degeneration, bilateral: Secondary | ICD-10-CM | POA: Diagnosis not present

## 2023-01-02 DIAGNOSIS — H348322 Tributary (branch) retinal vein occlusion, left eye, stable: Secondary | ICD-10-CM | POA: Diagnosis not present

## 2023-01-07 ENCOUNTER — Ambulatory Visit (HOSPITAL_COMMUNITY): Payer: Medicare Other | Attending: Cardiology

## 2023-01-07 DIAGNOSIS — I340 Nonrheumatic mitral (valve) insufficiency: Secondary | ICD-10-CM | POA: Diagnosis not present

## 2023-01-07 LAB — ECHOCARDIOGRAM COMPLETE: S' Lateral: 3.7 cm

## 2023-01-10 ENCOUNTER — Encounter (INDEPENDENT_AMBULATORY_CARE_PROVIDER_SITE_OTHER): Payer: Medicare Other | Admitting: Ophthalmology

## 2023-01-10 DIAGNOSIS — H43813 Vitreous degeneration, bilateral: Secondary | ICD-10-CM

## 2023-01-10 DIAGNOSIS — H348322 Tributary (branch) retinal vein occlusion, left eye, stable: Secondary | ICD-10-CM

## 2023-01-10 DIAGNOSIS — I10 Essential (primary) hypertension: Secondary | ICD-10-CM

## 2023-01-10 DIAGNOSIS — H35033 Hypertensive retinopathy, bilateral: Secondary | ICD-10-CM

## 2023-01-24 NOTE — Progress Notes (Signed)
  Cardiology Office Note:   Date:  01/25/2023  ID:  Karle Barr, DOB 06/22/1941, MRN [id number]  History of Present Illness:   [name] [name] is a 82 y.o. female who presents for followup of atrial fibrillation.   She had some moderate mitral regurgitation on an echo recently suggested that at least moderate MR.  I brought her back to consider TEE.  She has had no new symptoms.  She gets around slowly with a cane. The patient denies any new symptoms such as chest discomfort, neck or arm discomfort. There has been no new shortness of breath, PND or orthopnea. There have been no reported palpitations, presyncope or syncope.    ROS: As stated in the HPI and negative for all other systems.  Studies Reviewed:    EKG:  NA  Risk Assessment/Calculations:              Physical Exam:   VS:  BP 134/84   Pulse 61   Ht 5' 4.5" (1.638 m)   Wt 242 lb 6.4 oz (110 kg)   SpO2 99%   BMI 40.97 kg/m    Wt Readings from Last 3 Encounters:  01/25/23 242 lb 6.4 oz (110 kg)  12/05/22 244 lb (110.7 kg)  12/11/21 262 lb (118.8 kg)     GEN: Well nourished, well developed in no acute distress NECK: No JVD; No carotid bruits CARDIAC: RRR, 2 out of 6 apical systolic murmur radiating at the aortic outflow tract, no diastolic murmurs, rubs, gallops RESPIRATORY:  Clear to auscultation without rales, wheezing or rhonchi  ABDOMEN: Soft, non-tender, non-distended EXTREMITIES:  No edema; No deformity   ASSESSMENT AND PLAN:   HTN: The blood pressure is at target. No change in medications is indicated. We will continue with therapeutic lifestyle changes (TLC).   HL: She has been intolerant of statins and has not wanted PCSK9.   MR: She has a least moderate mitral regurgitation and needs a TEE.  I discussed this with her.  Obesity: The patient understands the need to lose weight with diet and exercise. We have discussed specific strategies for this.      Leg pain: She had normal ABIs.  No  change in therapy.        Signed, Rollene Rotunda, MD

## 2023-01-24 NOTE — H&P (View-Only) (Signed)
  Cardiology Office Note:   Date:  01/25/2023  ID:  [name] [name], DOB 06/22/1941, MRN [id number]  History of Present Illness:   [name] [name] is a 82 y.o. female who presents for followup of atrial fibrillation.   She had some moderate mitral regurgitation on an echo recently suggested that at least moderate MR.  I brought her back to consider TEE.  She has had no new symptoms.  She gets around slowly with a cane. The patient denies any new symptoms such as chest discomfort, neck or arm discomfort. There has been no new shortness of breath, PND or orthopnea. There have been no reported palpitations, presyncope or syncope.    ROS: As stated in the HPI and negative for all other systems.  Studies Reviewed:    EKG:  NA  Risk Assessment/Calculations:              Physical Exam:   VS:  BP 134/84   Pulse 61   Ht 5' 4.5" (1.638 m)   Wt 242 lb 6.4 oz (110 kg)   SpO2 99%   BMI 40.97 kg/m    Wt Readings from Last 3 Encounters:  01/25/23 242 lb 6.4 oz (110 kg)  12/05/22 244 lb (110.7 kg)  12/11/21 262 lb (118.8 kg)     GEN: Well nourished, well developed in no acute distress NECK: No JVD; No carotid bruits CARDIAC: RRR, 2 out of 6 apical systolic murmur radiating at the aortic outflow tract, no diastolic murmurs, rubs, gallops RESPIRATORY:  Clear to auscultation without rales, wheezing or rhonchi  ABDOMEN: Soft, non-tender, non-distended EXTREMITIES:  No edema; No deformity   ASSESSMENT AND PLAN:   HTN: The blood pressure is at target. No change in medications is indicated. We will continue with therapeutic lifestyle changes (TLC).   HL: She has been intolerant of statins and has not wanted PCSK9.   MR: She has a least moderate mitral regurgitation and needs a TEE.  I discussed this with her.  Obesity: The patient understands the need to lose weight with diet and exercise. We have discussed specific strategies for this.      Leg pain: She had normal ABIs.  No  change in therapy.        Signed, Rollene Rotunda, MD

## 2023-01-25 ENCOUNTER — Encounter: Payer: Self-pay | Admitting: Cardiology

## 2023-01-25 ENCOUNTER — Ambulatory Visit: Payer: Medicare Other | Attending: Cardiology | Admitting: Cardiology

## 2023-01-25 VITALS — BP 134/84 | HR 61 | Ht 64.5 in | Wt 242.4 lb

## 2023-01-25 DIAGNOSIS — E785 Hyperlipidemia, unspecified: Secondary | ICD-10-CM | POA: Insufficient documentation

## 2023-01-25 DIAGNOSIS — I340 Nonrheumatic mitral (valve) insufficiency: Secondary | ICD-10-CM | POA: Diagnosis not present

## 2023-01-25 DIAGNOSIS — I10 Essential (primary) hypertension: Secondary | ICD-10-CM | POA: Diagnosis not present

## 2023-01-25 LAB — CBC

## 2023-01-25 LAB — BASIC METABOLIC PANEL
Chloride: 104 mmol/L (ref 96–106)
Glucose: 87 mg/dL (ref 70–99)
Sodium: 145 mmol/L — ABNORMAL HIGH (ref 134–144)

## 2023-01-25 NOTE — Patient Instructions (Signed)
  Testing/Procedures:   You are scheduled for a TEE (Transesophageal Echocardiogram) on Tuesday, May 14 with Dr. Shari Prows.  Please arrive at the [facility] (Main Entrance A) at [facility]: [address] [place], [place] [address] at 12:30 PM (This time is 1 hour(s) before your procedure to ensure your preparation). Free valet parking service is available. You will check in at ADMITTING. The support person will be asked to wait in the waiting room.  It is OK to have someone drop you off and come back when you are ready to be discharged.     DIET:  Nothing to eat or drink after midnight except a sip of water with medications (see medication instructions below)  MEDICATION INSTRUCTIONS:  TAKE MEDICATIONS WITH SIPS OF WATER  FYI:  For your safety, and to allow [place] to monitor your vital signs accurately during the surgery/procedure we request: If you have artificial nails, gel coating, SNS etc, please have those removed prior to your surgery/procedure. Not having the nail coverings /polish removed may result in cancellation or delay of your surgery/procedure.  You must have a responsible person to drive you home and stay in the waiting area during your procedure. Failure to do so could result in cancellation.  Bring your insurance cards.  *Special Note: Every effort is made to have your procedure done on time. Occasionally there are emergencies that occur at the hospital that may cause delays. Please be patient if a delay does occur.       Follow-Up: At [facility], you and your health needs are our priority.  As part of our continuing mission to provide you with exceptional heart care, we have created designated Provider Care Teams.  These Care Teams include your primary Cardiologist (physician) and Advanced Practice Providers (APPs -  Physician Assistants and Nurse Practitioners) who all work together to provide you with the care you need, when you need it.  We recommend  signing up for the patient portal called "MyChart".  Sign up information is provided on this After Visit Summary.  MyChart is used to connect with patients for Virtual Visits (Telemedicine).  Patients are able to view lab/test results, encounter notes, upcoming appointments, etc.  Non-urgent messages can be sent to your provider as well.   To learn more about what you can do with MyChart, go to [email or web address].    Your next appointment:   12 month(s)  Provider:   Rollene Rotunda, MD

## 2023-01-26 LAB — CBC
Hemoglobin: 14.1 g/dL (ref 11.1–15.9)
MCV: 82 fL (ref 79–97)
Platelets: 230 10*3/uL (ref 150–450)
RBC: 5.29 x10E6/uL — ABNORMAL HIGH (ref 3.77–5.28)
RDW: 13.8 % (ref 11.7–15.4)

## 2023-01-26 LAB — BASIC METABOLIC PANEL
BUN/Creatinine Ratio: 12 (ref 12–28)
BUN: 11 mg/dL (ref 8–27)
CO2: 28 mmol/L (ref 20–29)
Calcium: 9.8 mg/dL (ref 8.7–10.3)
Creatinine, Ser: 0.89 mg/dL (ref 0.57–1.00)
Potassium: 4.3 mmol/L (ref 3.5–5.2)
eGFR: 65 mL/min/{1.73_m2} (ref >59)

## 2023-01-28 ENCOUNTER — Telehealth: Payer: Self-pay | Admitting: Cardiology

## 2023-01-28 NOTE — Telephone Encounter (Signed)
Called patient, advised of instructions for TEE in chart.   Patient verbalized understanding.

## 2023-01-28 NOTE — Pre-Procedure Instructions (Signed)
Spoke with patient regarding procedure time.  She will be here at 10:30.  Nothing to eat or drink after midnight.  She will have a responsible person to stay with her and take her home.

## 2023-01-28 NOTE — Telephone Encounter (Signed)
Pt called in she has some questions about how to take her medications tomorrow before her TEE, please advise.

## 2023-01-29 ENCOUNTER — Encounter (HOSPITAL_COMMUNITY): Payer: Self-pay | Admitting: Cardiology

## 2023-01-29 ENCOUNTER — Ambulatory Visit (HOSPITAL_BASED_OUTPATIENT_CLINIC_OR_DEPARTMENT_OTHER): Payer: Medicare Other

## 2023-01-29 ENCOUNTER — Ambulatory Visit (HOSPITAL_COMMUNITY)
Admission: RE | Admit: 2023-01-29 | Discharge: 2023-01-29 | Disposition: A | Discharge status: Home / Self Care | Payer: Medicare Other | Attending: Cardiology | Admitting: Cardiology

## 2023-01-29 ENCOUNTER — Ambulatory Visit (HOSPITAL_BASED_OUTPATIENT_CLINIC_OR_DEPARTMENT_OTHER): Payer: Medicare Other | Admitting: Certified Registered"

## 2023-01-29 ENCOUNTER — Encounter (HOSPITAL_COMMUNITY)
Admission: RE | Disposition: A | Discharge status: Home / Self Care | Payer: Self-pay | Source: Home / Self Care | Attending: Cardiology

## 2023-01-29 ENCOUNTER — Other Ambulatory Visit: Payer: Self-pay

## 2023-01-29 ENCOUNTER — Ambulatory Visit (HOSPITAL_COMMUNITY): Payer: Medicare Other | Admitting: Certified Registered"

## 2023-01-29 DIAGNOSIS — M79606 Pain in leg, unspecified: Secondary | ICD-10-CM | POA: Diagnosis not present

## 2023-01-29 DIAGNOSIS — I10 Essential (primary) hypertension: Secondary | ICD-10-CM | POA: Diagnosis not present

## 2023-01-29 DIAGNOSIS — Z6841 Body Mass Index (BMI) 40.0 and over, adult: Secondary | ICD-10-CM | POA: Diagnosis not present

## 2023-01-29 DIAGNOSIS — I4891 Unspecified atrial fibrillation: Secondary | ICD-10-CM | POA: Diagnosis not present

## 2023-01-29 DIAGNOSIS — E669 Obesity, unspecified: Secondary | ICD-10-CM | POA: Diagnosis not present

## 2023-01-29 DIAGNOSIS — I340 Nonrheumatic mitral (valve) insufficiency: Secondary | ICD-10-CM

## 2023-01-29 DIAGNOSIS — I083 Combined rheumatic disorders of mitral, aortic and tricuspid valves: Secondary | ICD-10-CM | POA: Insufficient documentation

## 2023-01-29 DIAGNOSIS — J45909 Unspecified asthma, uncomplicated: Secondary | ICD-10-CM | POA: Diagnosis not present

## 2023-01-29 HISTORY — PX: TEE WITHOUT CARDIOVERSION: SHX5443

## 2023-01-29 LAB — ECHO TEE
MV M vel: 4.32 m/s
MV Peak grad: 74.6 mmHg
Radius: 0.5 cm

## 2023-01-29 SURGERY — ECHOCARDIOGRAM, TRANSESOPHAGEAL
Anesthesia: Monitor Anesthesia Care

## 2023-01-29 MED ORDER — PHENYLEPHRINE 80 MCG/ML (10ML) SYRINGE FOR IV PUSH (FOR BLOOD PRESSURE SUPPORT)
PREFILLED_SYRINGE | INTRAVENOUS | Status: DC | PRN
  Administered 2023-01-29 (×3): 80 ug via INTRAVENOUS

## 2023-01-29 MED ORDER — PROPOFOL 500 MG/50ML IV EMUL
INTRAVENOUS | Status: DC | PRN
  Administered 2023-01-29: 150 ug/kg/min via INTRAVENOUS

## 2023-01-29 MED ORDER — PROPOFOL 10 MG/ML IV BOLUS
INTRAVENOUS | Status: DC | PRN
  Administered 2023-01-29: 20 mg via INTRAVENOUS

## 2023-01-29 MED ORDER — DEXMEDETOMIDINE HCL IN NACL 80 MCG/20ML IV SOLN
INTRAVENOUS | Status: DC | PRN
  Administered 2023-01-29 (×2): 4 ug via INTRAVENOUS

## 2023-01-29 MED ORDER — LIDOCAINE HCL (CARDIAC) PF 100 MG/5ML IV SOSY
PREFILLED_SYRINGE | INTRAVENOUS | Status: DC | PRN
  Administered 2023-01-29: 20 mg via INTRAVENOUS
  Administered 2023-01-29: 60 mg via INTRAVENOUS

## 2023-01-29 MED ORDER — SODIUM CHLORIDE 0.9 % IV SOLN: INTRAVENOUS | Status: DC

## 2023-01-29 NOTE — Transfer of Care (Signed)
Immediate Anesthesia Transfer of Care Note  Patient: [name] [name]  Procedure(s) Performed: TRANSESOPHAGEAL ECHOCARDIOGRAM  Patient Location: PACU and Cath Lab  Anesthesia Type:MAC  Level of Consciousness: drowsy  Airway & Oxygen Therapy: Patient Spontanous Breathing and Patient connected to nasal cannula oxygen  Post-op Assessment: Report given to RN and Post -op Vital signs reviewed and stable  Post vital signs: Reviewed and stable  Last Vitals:  Vitals Value Taken Time  BP    Temp    Pulse    Resp    SpO2      Last Pain:  Vitals:   01/29/23 1008  TempSrc: Temporal         Complications: No notable events documented.

## 2023-01-29 NOTE — Interval H&P Note (Signed)
History and Physical Interval Note:  01/29/2023 10:46 AM  [name] [name]  has presented today for surgery, with the diagnosis of mr disorder.  The various methods of treatment have been discussed with the patient and family. After consideration of risks, benefits and other options for treatment, the patient has consented to  Procedure(s): TRANSESOPHAGEAL ECHOCARDIOGRAM (N/A) as a surgical intervention.  The patient's history has been reviewed, patient examined, no change in status, stable for surgery.  I have reviewed the patient's chart and labs.  Questions were answered to the patient's satisfaction.     [name]

## 2023-01-29 NOTE — Anesthesia Preprocedure Evaluation (Signed)
Anesthesia Evaluation  Patient identified by MRN, date of birth, ID band Patient awake    Reviewed: Allergy & Precautions, NPO status , Patient's Chart, lab work & pertinent test results  Airway Mallampati: II  TM Distance: >3 FB Neck ROM: Full    Dental  (+) Dental Advisory Given   Pulmonary asthma    breath sounds clear to auscultation       Cardiovascular hypertension, Pt. on medications and Pt. on home beta blockers + dysrhythmias Atrial Fibrillation + Valvular Problems/Murmurs MR  Rhythm:Regular Rate:Normal     Neuro/Psych negative neurological ROS     GI/Hepatic negative GI ROS, Neg liver ROS,,,  Endo/Other  Hypothyroidism  Morbid obesity  Renal/GU negative Renal ROS     Musculoskeletal  (+) Arthritis ,    Abdominal   Peds  Hematology  (+) Blood dyscrasia, anemia   Anesthesia Other Findings   Reproductive/Obstetrics                             Anesthesia Physical Anesthesia Plan  ASA: 3  Anesthesia Plan: MAC   Post-op Pain Management: Minimal or no pain anticipated   Induction:   PONV Risk Score and Plan: 2 and Propofol infusion and Ondansetron  Airway Management Planned: Natural Airway and Nasal Cannula  Additional Equipment:   Intra-op Plan:   Post-operative Plan:   Informed Consent: I have reviewed the patients History and Physical, chart, labs and discussed the procedure including the risks, benefits and alternatives for the proposed anesthesia with the patient or authorized representative who has indicated his/her understanding and acceptance.       Plan Discussed with: CRNA  Anesthesia Plan Comments:        Anesthesia Quick Evaluation

## 2023-01-29 NOTE — Discharge Instructions (Signed)

## 2023-01-29 NOTE — CV Procedure (Signed)
     Transesophageal Echocardiogram Note  Xitlally Vanderlugt [id number] 10/22/1940  Procedure: Transesophageal Echocardiogram Indications: Moderate MR  Procedure Details Consent: Obtained Time Out: Verified patient identification, verified procedure, site/side was marked, verified correct patient position, special equipment/implants available, Radiology Safety Procedures followed,  medications/allergies/relevent history reviewed, required imaging and test results available.  Performed  Medications: Propofol: 290mg  Lidocaine: 100mg   Left Ventrical:  LVEF 60-65%  Mitral Valve: Degenerative. Mild to moderate, posteriorly directed mitral regurgitation. Normal pulmonary venous flow pattern.  Aortic Valve: Tricuspid. No AR  Tricuspid Valve: Normal structure, mild TR  Pulmonic Valve: Normal structure, mild PR  Left Atrium/ Left atrial appendage: No LAA thrombus  Atrial septum: Grossly normal  Aorta: Mild plaquing   Complications: No apparent complications Patient did tolerate procedure well.  [name], MD 01/29/2023, 11:15 AM

## 2023-01-29 NOTE — Progress Notes (Signed)
  Echocardiogram Echocardiogram Transesophageal has been performed.  Delcie Roch 01/29/2023, 11:41 AM

## 2023-01-30 ENCOUNTER — Encounter (HOSPITAL_COMMUNITY): Payer: Self-pay | Admitting: Cardiology

## 2023-01-31 NOTE — Anesthesia Postprocedure Evaluation (Signed)
Anesthesia Post Note  Patient: [name] [name]  Procedure(s) Performed: TRANSESOPHAGEAL ECHOCARDIOGRAM     Patient location during evaluation: PACU Anesthesia Type: MAC Level of consciousness: awake and alert Pain management: pain level controlled Vital Signs Assessment: post-procedure vital signs reviewed and stable Respiratory status: spontaneous breathing, nonlabored ventilation, respiratory function stable and patient connected to nasal cannula oxygen Cardiovascular status: stable and blood pressure returned to baseline Postop Assessment: no apparent nausea or vomiting Anesthetic complications: no   No notable events documented.  Last Vitals:  Vitals:   01/29/23 1135 01/29/23 1140  BP:    Pulse: 74 76  Resp: (!) 24 19  Temp:    SpO2: 94% 95%    Last Pain:  Vitals:   01/29/23 1125  TempSrc: Temporal  PainSc: 0-No pain                 Kennieth Rad

## 2023-04-11 DIAGNOSIS — D573 Sickle-cell trait: Secondary | ICD-10-CM | POA: Diagnosis not present

## 2023-04-11 DIAGNOSIS — K219 Gastro-esophageal reflux disease without esophagitis: Secondary | ICD-10-CM | POA: Diagnosis not present

## 2023-04-11 DIAGNOSIS — R7303 Prediabetes: Secondary | ICD-10-CM | POA: Diagnosis not present

## 2023-04-11 DIAGNOSIS — R7301 Impaired fasting glucose: Secondary | ICD-10-CM | POA: Diagnosis not present

## 2023-04-11 DIAGNOSIS — I482 Chronic atrial fibrillation, unspecified: Secondary | ICD-10-CM | POA: Diagnosis not present

## 2023-04-11 DIAGNOSIS — D6869 Other thrombophilia: Secondary | ICD-10-CM | POA: Diagnosis not present

## 2023-04-11 DIAGNOSIS — M109 Gout, unspecified: Secondary | ICD-10-CM | POA: Diagnosis not present

## 2023-04-11 DIAGNOSIS — R5383 Other fatigue: Secondary | ICD-10-CM | POA: Diagnosis not present

## 2023-04-11 DIAGNOSIS — I10 Essential (primary) hypertension: Secondary | ICD-10-CM | POA: Diagnosis not present

## 2023-04-11 DIAGNOSIS — E782 Mixed hyperlipidemia: Secondary | ICD-10-CM | POA: Diagnosis not present

## 2023-04-18 DIAGNOSIS — E782 Mixed hyperlipidemia: Secondary | ICD-10-CM | POA: Diagnosis not present

## 2023-04-18 DIAGNOSIS — R809 Proteinuria, unspecified: Secondary | ICD-10-CM | POA: Diagnosis not present

## 2023-04-18 DIAGNOSIS — I482 Chronic atrial fibrillation, unspecified: Secondary | ICD-10-CM | POA: Diagnosis not present

## 2023-04-18 DIAGNOSIS — M109 Gout, unspecified: Secondary | ICD-10-CM | POA: Diagnosis not present

## 2023-04-18 DIAGNOSIS — I10 Essential (primary) hypertension: Secondary | ICD-10-CM | POA: Diagnosis not present

## 2023-04-18 DIAGNOSIS — Z0000 Encounter for general adult medical examination without abnormal findings: Secondary | ICD-10-CM | POA: Diagnosis not present

## 2023-04-18 DIAGNOSIS — Z23 Encounter for immunization: Secondary | ICD-10-CM | POA: Diagnosis not present

## 2023-04-18 DIAGNOSIS — N182 Chronic kidney disease, stage 2 (mild): Secondary | ICD-10-CM | POA: Diagnosis not present

## 2023-04-18 DIAGNOSIS — G720 Drug-induced myopathy: Secondary | ICD-10-CM | POA: Diagnosis not present

## 2023-04-18 DIAGNOSIS — D6869 Other thrombophilia: Secondary | ICD-10-CM | POA: Diagnosis not present

## 2023-04-18 DIAGNOSIS — R7303 Prediabetes: Secondary | ICD-10-CM | POA: Diagnosis not present

## 2023-04-18 DIAGNOSIS — D573 Sickle-cell trait: Secondary | ICD-10-CM | POA: Diagnosis not present

## 2023-05-30 ENCOUNTER — Other Ambulatory Visit: Payer: Self-pay | Admitting: Cardiology

## 2023-05-30 DIAGNOSIS — I4821 Permanent atrial fibrillation: Secondary | ICD-10-CM

## 2023-05-30 NOTE — Telephone Encounter (Signed)
Prescription refill request for Eliquis received. Indication: Afib Last office visit: 01/25/23 (Hochrein)  Scr: 0.89 (01/25/23)  Age: 82 Weight: 109.8kg  Appropriate dose. Refill sent.

## 2023-06-20 DIAGNOSIS — N182 Chronic kidney disease, stage 2 (mild): Secondary | ICD-10-CM | POA: Diagnosis not present

## 2023-06-20 DIAGNOSIS — R7303 Prediabetes: Secondary | ICD-10-CM | POA: Diagnosis not present

## 2023-06-20 DIAGNOSIS — R809 Proteinuria, unspecified: Secondary | ICD-10-CM | POA: Diagnosis not present

## 2023-06-27 DIAGNOSIS — I10 Essential (primary) hypertension: Secondary | ICD-10-CM | POA: Diagnosis not present

## 2023-06-27 DIAGNOSIS — R7303 Prediabetes: Secondary | ICD-10-CM | POA: Diagnosis not present

## 2023-06-27 DIAGNOSIS — Z23 Encounter for immunization: Secondary | ICD-10-CM | POA: Diagnosis not present

## 2023-06-27 DIAGNOSIS — E782 Mixed hyperlipidemia: Secondary | ICD-10-CM | POA: Diagnosis not present

## 2023-06-27 DIAGNOSIS — I482 Chronic atrial fibrillation, unspecified: Secondary | ICD-10-CM | POA: Diagnosis not present

## 2023-06-27 DIAGNOSIS — R809 Proteinuria, unspecified: Secondary | ICD-10-CM | POA: Diagnosis not present

## 2023-06-27 DIAGNOSIS — N182 Chronic kidney disease, stage 2 (mild): Secondary | ICD-10-CM | POA: Diagnosis not present

## 2023-07-03 DIAGNOSIS — H348322 Tributary (branch) retinal vein occlusion, left eye, stable: Secondary | ICD-10-CM | POA: Diagnosis not present

## 2023-07-03 DIAGNOSIS — H26492 Other secondary cataract, left eye: Secondary | ICD-10-CM | POA: Diagnosis not present

## 2023-07-03 DIAGNOSIS — H35352 Cystoid macular degeneration, left eye: Secondary | ICD-10-CM | POA: Diagnosis not present

## 2023-07-03 DIAGNOSIS — H43813 Vitreous degeneration, bilateral: Secondary | ICD-10-CM | POA: Diagnosis not present

## 2023-07-03 DIAGNOSIS — H04123 Dry eye syndrome of bilateral lacrimal glands: Secondary | ICD-10-CM | POA: Diagnosis not present

## 2023-07-03 DIAGNOSIS — Z961 Presence of intraocular lens: Secondary | ICD-10-CM | POA: Diagnosis not present

## 2023-07-03 DIAGNOSIS — H40023 Open angle with borderline findings, high risk, bilateral: Secondary | ICD-10-CM | POA: Diagnosis not present

## 2023-11-03 ENCOUNTER — Other Ambulatory Visit: Payer: Self-pay | Admitting: Cardiology

## 2023-11-03 DIAGNOSIS — I4821 Permanent atrial fibrillation: Secondary | ICD-10-CM

## 2023-11-05 NOTE — Telephone Encounter (Signed)
 Prescription refill request for Eliquis received. Indication: AF Last office visit: 01/25/23  Daiva Nakayama MD Scr: 0.89 on 01/25/23  Epic Age: 83 Weight: 110kg

## 2023-11-28 DIAGNOSIS — Z1231 Encounter for screening mammogram for malignant neoplasm of breast: Secondary | ICD-10-CM | POA: Diagnosis not present

## 2023-12-31 DIAGNOSIS — H43813 Vitreous degeneration, bilateral: Secondary | ICD-10-CM | POA: Diagnosis not present

## 2023-12-31 DIAGNOSIS — H35352 Cystoid macular degeneration, left eye: Secondary | ICD-10-CM | POA: Diagnosis not present

## 2023-12-31 DIAGNOSIS — H04123 Dry eye syndrome of bilateral lacrimal glands: Secondary | ICD-10-CM | POA: Diagnosis not present

## 2023-12-31 DIAGNOSIS — Z961 Presence of intraocular lens: Secondary | ICD-10-CM | POA: Diagnosis not present

## 2023-12-31 DIAGNOSIS — H348322 Tributary (branch) retinal vein occlusion, left eye, stable: Secondary | ICD-10-CM | POA: Diagnosis not present

## 2023-12-31 DIAGNOSIS — H26492 Other secondary cataract, left eye: Secondary | ICD-10-CM | POA: Diagnosis not present

## 2023-12-31 DIAGNOSIS — H40023 Open angle with borderline findings, high risk, bilateral: Secondary | ICD-10-CM | POA: Diagnosis not present

## 2024-01-14 DIAGNOSIS — Z01419 Encounter for gynecological examination (general) (routine) without abnormal findings: Secondary | ICD-10-CM | POA: Diagnosis not present

## 2024-01-14 DIAGNOSIS — Z124 Encounter for screening for malignant neoplasm of cervix: Secondary | ICD-10-CM | POA: Diagnosis not present

## 2024-01-15 ENCOUNTER — Encounter (INDEPENDENT_AMBULATORY_CARE_PROVIDER_SITE_OTHER): Payer: Medicare Other | Admitting: Ophthalmology

## 2024-01-15 DIAGNOSIS — H348322 Tributary (branch) retinal vein occlusion, left eye, stable: Secondary | ICD-10-CM

## 2024-01-15 DIAGNOSIS — H43813 Vitreous degeneration, bilateral: Secondary | ICD-10-CM | POA: Diagnosis not present

## 2024-01-15 DIAGNOSIS — H35033 Hypertensive retinopathy, bilateral: Secondary | ICD-10-CM

## 2024-01-15 DIAGNOSIS — I10 Essential (primary) hypertension: Secondary | ICD-10-CM

## 2024-01-30 DIAGNOSIS — I10 Essential (primary) hypertension: Secondary | ICD-10-CM | POA: Diagnosis not present

## 2024-01-30 DIAGNOSIS — R809 Proteinuria, unspecified: Secondary | ICD-10-CM | POA: Diagnosis not present

## 2024-01-30 DIAGNOSIS — N182 Chronic kidney disease, stage 2 (mild): Secondary | ICD-10-CM | POA: Diagnosis not present

## 2024-01-30 DIAGNOSIS — R7303 Prediabetes: Secondary | ICD-10-CM | POA: Diagnosis not present

## 2024-01-30 DIAGNOSIS — E782 Mixed hyperlipidemia: Secondary | ICD-10-CM | POA: Diagnosis not present

## 2024-01-30 DIAGNOSIS — I482 Chronic atrial fibrillation, unspecified: Secondary | ICD-10-CM | POA: Diagnosis not present

## 2024-02-05 ENCOUNTER — Other Ambulatory Visit: Payer: Self-pay | Admitting: Cardiology

## 2024-02-06 DIAGNOSIS — N182 Chronic kidney disease, stage 2 (mild): Secondary | ICD-10-CM | POA: Diagnosis not present

## 2024-02-06 DIAGNOSIS — E782 Mixed hyperlipidemia: Secondary | ICD-10-CM | POA: Diagnosis not present

## 2024-02-06 DIAGNOSIS — R809 Proteinuria, unspecified: Secondary | ICD-10-CM | POA: Diagnosis not present

## 2024-02-06 DIAGNOSIS — M79606 Pain in leg, unspecified: Secondary | ICD-10-CM | POA: Insufficient documentation

## 2024-02-06 DIAGNOSIS — I10 Essential (primary) hypertension: Secondary | ICD-10-CM | POA: Diagnosis not present

## 2024-02-06 DIAGNOSIS — R7303 Prediabetes: Secondary | ICD-10-CM | POA: Diagnosis not present

## 2024-02-06 DIAGNOSIS — I482 Chronic atrial fibrillation, unspecified: Secondary | ICD-10-CM | POA: Diagnosis not present

## 2024-02-06 NOTE — Progress Notes (Signed)
  Cardiology Office Note:   Date:  02/07/2024  ID:  [name] [name], DOB 24-Oct-1940, MRN [id number] PCP: Virgle Grime, MD  [facility] Providers Cardiologist:  Eilleen Grates, MD {  History of Present Illness:   [name] [name] is a 83 y.o. female who presents for followup of atrial fibrillation.   She had some moderate mitral regurgitation on an echo recently suggested that at least moderate MR.  I brought her back for TEE. This demonstrated that the MR was mild to moderate and thought to be related to annular dilatation.  She has lost about 30 lbs and is doing well since I last saw her. The patient denies any new symptoms such as chest discomfort, neck or arm discomfort. There has been no new shortness of breath, PND or orthopnea. There have been no reported palpitations, presyncope or syncope.   ROS:  As stated in the HPI and negative for all other systems.   Studies Reviewed:    EKG:   EKG Interpretation Date/Time:  Friday Feb 07 2024 11:24:51 EDT Ventricular Rate:  99 PR Interval:    QRS Duration:  94 QT Interval:  362 QTC Calculation: 464 R Axis:   -19  Text Interpretation: Atrial fibrillation Poor anterior R wave progression When compared with ECG of 15-Dec-2013 08:30, No significant change since last tracing Confirmed by Eilleen Grates ([id number]) on 02/07/2024 12:12:17 PM   Risk Assessment/Calculations:     [name] [name] has a CHA2DS2 - VASc score of 4.    Physical Exam:   VS:  BP (!) 144/98   Pulse 99   Ht 5\' 7"  (1.702 m)   Wt 230 lb (104.3 kg)   SpO2 99%   BMI 36.02 kg/m    Wt Readings from Last 3 Encounters:  02/07/24 230 lb (104.3 kg)  01/29/23 242 lb (109.8 kg)  01/25/23 242 lb 6.4 oz (110 kg)     GEN: Well nourished, well developed in no acute distress NECK: No JVD; No carotid bruits CARDIAC: Irregular RR, no murmurs, rubs, gallops RESPIRATORY:  Clear to auscultation without rales, wheezing or rhonchi  ABDOMEN: Soft,  non-tender, non-distended EXTREMITIES:  No edema; No deformity   ASSESSMENT AND PLAN:   HTN: The blood pressure is mildly elevated but this is very unusual.  I reviewed previous readings and her BP is usually at target.  The blood pressure continues to be high. I have instructed the patient to record a blood pressure diary and recording this. This will be presented for my review and pending these results I will make further suggestions about changes in therapy for optimal blood pressure control.  HL: She is intolerant of statins and has not wanted PCSK9.  No change in therapy.   MR: This was mild to moderate MR on TEE in May.  I will follow this clinically.  She would want more conservative therapy.  Obesity: I am proud of her weight loss.  No change in therapy.  Atrial fib: She tolerates anticoagulation and does not notice her fibrillation.  No change in therapy.     Follow up with me in 12 months.   Signed, Eilleen Grates, MD

## 2024-02-07 ENCOUNTER — Ambulatory Visit: Payer: Medicare Other | Attending: Cardiology | Admitting: Cardiology

## 2024-02-07 ENCOUNTER — Encounter: Payer: Self-pay | Admitting: Cardiology

## 2024-02-07 VITALS — BP 144/98 | HR 99 | Ht 67.0 in | Wt 230.0 lb

## 2024-02-07 DIAGNOSIS — M79606 Pain in leg, unspecified: Secondary | ICD-10-CM

## 2024-02-07 DIAGNOSIS — E785 Hyperlipidemia, unspecified: Secondary | ICD-10-CM | POA: Diagnosis not present

## 2024-02-07 DIAGNOSIS — I10 Essential (primary) hypertension: Secondary | ICD-10-CM

## 2024-02-07 DIAGNOSIS — I340 Nonrheumatic mitral (valve) insufficiency: Secondary | ICD-10-CM

## 2024-02-07 NOTE — Patient Instructions (Signed)
 Medication Instructions:  The current medical regimen is effective;  continue present plan and medications.  *If you need a refill on your cardiac medications before your next appointment, please call your pharmacy*  Follow-Up: At [facility], you and your health needs are our priority.  As part of our continuing mission to provide you with exceptional heart care, our providers are all part of one team.  This team includes your primary Cardiologist (physician) and Advanced Practice Providers or APPs (Physician Assistants and Nurse Practitioners) who all work together to provide you with the care you need, when you need it.  Your next appointment:   1 year(s)  Provider:   Rollene Rotunda, MD    We recommend signing up for the patient portal called "MyChart".  Sign up information is provided on this After Visit Summary.  MyChart is used to connect with patients for Virtual Visits (Telemedicine).  Patients are able to view lab/test results, encounter notes, upcoming appointments, etc.  Non-urgent messages can be sent to your provider as well.   To learn more about what you can do with MyChart, go to [email or web address].

## 2024-03-17 ENCOUNTER — Other Ambulatory Visit: Payer: Self-pay | Admitting: Cardiology

## 2024-03-26 ENCOUNTER — Other Ambulatory Visit: Payer: Self-pay | Admitting: Cardiology

## 2024-03-26 DIAGNOSIS — I4821 Permanent atrial fibrillation: Secondary | ICD-10-CM

## 2024-03-27 NOTE — Telephone Encounter (Signed)
 Pt last saw Dr Lavona 02/07/24, last labs 01/30/24 Creat 0.91 at [facility] per [facility], age 83, weight 104.3kg, based on specified criteria pt is on appropriate dosage of Eliquis  5mg  BID for afib.  Will refill rx.

## 2024-05-14 DIAGNOSIS — R5383 Other fatigue: Secondary | ICD-10-CM | POA: Diagnosis not present

## 2024-05-14 DIAGNOSIS — I482 Chronic atrial fibrillation, unspecified: Secondary | ICD-10-CM | POA: Diagnosis not present

## 2024-05-14 DIAGNOSIS — N182 Chronic kidney disease, stage 2 (mild): Secondary | ICD-10-CM | POA: Diagnosis not present

## 2024-05-14 DIAGNOSIS — E782 Mixed hyperlipidemia: Secondary | ICD-10-CM | POA: Diagnosis not present

## 2024-05-14 DIAGNOSIS — R7303 Prediabetes: Secondary | ICD-10-CM | POA: Diagnosis not present

## 2024-05-14 DIAGNOSIS — R809 Proteinuria, unspecified: Secondary | ICD-10-CM | POA: Diagnosis not present

## 2024-05-14 DIAGNOSIS — I10 Essential (primary) hypertension: Secondary | ICD-10-CM | POA: Diagnosis not present

## 2024-05-21 DIAGNOSIS — I482 Chronic atrial fibrillation, unspecified: Secondary | ICD-10-CM | POA: Diagnosis not present

## 2024-05-21 DIAGNOSIS — Z0000 Encounter for general adult medical examination without abnormal findings: Secondary | ICD-10-CM | POA: Diagnosis not present

## 2024-05-21 DIAGNOSIS — R7303 Prediabetes: Secondary | ICD-10-CM | POA: Diagnosis not present

## 2024-05-21 DIAGNOSIS — I10 Essential (primary) hypertension: Secondary | ICD-10-CM | POA: Diagnosis not present

## 2024-05-21 DIAGNOSIS — R809 Proteinuria, unspecified: Secondary | ICD-10-CM | POA: Diagnosis not present

## 2024-05-21 DIAGNOSIS — E782 Mixed hyperlipidemia: Secondary | ICD-10-CM | POA: Diagnosis not present

## 2024-05-21 DIAGNOSIS — N182 Chronic kidney disease, stage 2 (mild): Secondary | ICD-10-CM | POA: Diagnosis not present

## 2024-05-21 DIAGNOSIS — Z23 Encounter for immunization: Secondary | ICD-10-CM | POA: Diagnosis not present

## 2024-06-29 DIAGNOSIS — H26492 Other secondary cataract, left eye: Secondary | ICD-10-CM | POA: Diagnosis not present

## 2024-06-29 DIAGNOSIS — H04123 Dry eye syndrome of bilateral lacrimal glands: Secondary | ICD-10-CM | POA: Diagnosis not present

## 2024-06-29 DIAGNOSIS — Z961 Presence of intraocular lens: Secondary | ICD-10-CM | POA: Diagnosis not present

## 2024-06-29 DIAGNOSIS — H40023 Open angle with borderline findings, high risk, bilateral: Secondary | ICD-10-CM | POA: Diagnosis not present

## 2024-09-16 ENCOUNTER — Other Ambulatory Visit: Payer: Self-pay | Admitting: Cardiology

## 2025-01-13 ENCOUNTER — Encounter (INDEPENDENT_AMBULATORY_CARE_PROVIDER_SITE_OTHER): Admitting: Ophthalmology
